# Patient Record
Sex: Male | Born: 1945 | Race: White | Hispanic: No | Marital: Married | Smoking: Current every day smoker
Health system: Southern US, Community
[De-identification: ages and names within clinical notes are randomized; demographics above are authoritative.]

## PROBLEM LIST (undated history)

## (undated) DIAGNOSIS — C3492 Malignant neoplasm of unspecified part of left bronchus or lung: Secondary | ICD-10-CM

## (undated) DIAGNOSIS — R569 Unspecified convulsions: Secondary | ICD-10-CM

## (undated) HISTORY — PX: CHOLECYSTECTOMY: SHX55

## (undated) HISTORY — DX: Unspecified convulsions: R56.9

## (undated) HISTORY — DX: Malignant neoplasm of unspecified part of left bronchus or lung: C34.92

---

## 2013-05-22 ENCOUNTER — Telehealth: Payer: Self-pay | Admitting: *Deleted

## 2013-05-22 MED ORDER — LEVETIRACETAM 500 MG PO TABS: 500.0000 mg | ORAL_TABLET | Freq: Two times a day (BID) | ORAL | Status: DC

## 2013-05-22 NOTE — Telephone Encounter (Signed)
Patient scheduled an appt, Rx has been sent.

## 2013-07-24 ENCOUNTER — Encounter: Payer: Self-pay | Admitting: Nurse Practitioner

## 2013-07-25 ENCOUNTER — Encounter: Payer: Self-pay | Admitting: Nurse Practitioner

## 2013-07-25 ENCOUNTER — Ambulatory Visit (INDEPENDENT_AMBULATORY_CARE_PROVIDER_SITE_OTHER): Payer: Medicare Other | Admitting: Nurse Practitioner

## 2013-07-25 ENCOUNTER — Encounter (INDEPENDENT_AMBULATORY_CARE_PROVIDER_SITE_OTHER): Payer: Self-pay

## 2013-07-25 VITALS — BP 133/77 | HR 76 | Ht 65.0 in | Wt 150.0 lb

## 2013-07-25 DIAGNOSIS — R569 Unspecified convulsions: Secondary | ICD-10-CM

## 2013-07-25 DIAGNOSIS — G40209 Localization-related (focal) (partial) symptomatic epilepsy and epileptic syndromes with complex partial seizures, not intractable, without status epilepticus: Secondary | ICD-10-CM

## 2013-07-25 MED ORDER — LEVETIRACETAM 500 MG PO TABS: 500.0000 mg | ORAL_TABLET | Freq: Two times a day (BID) | ORAL | Status: DC
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
## 2013-07-25 NOTE — Patient Instructions (Signed)
Continue Keppra 500 mg twice daily, will refill for one year Call for any further seizure activity Followup yearly and when necessary

## 2014-05-08 ENCOUNTER — Encounter: Payer: Self-pay | Admitting: Neurology

## 2014-05-14 ENCOUNTER — Encounter: Payer: Self-pay | Admitting: Neurology

## 2014-07-25 ENCOUNTER — Ambulatory Visit: Payer: Self-pay | Admitting: Neurology

## 2014-08-06 ENCOUNTER — Other Ambulatory Visit: Payer: Self-pay | Admitting: Nurse Practitioner

## 2014-09-03 ENCOUNTER — Ambulatory Visit: Payer: Self-pay | Admitting: Neurology

## 2014-09-06 ENCOUNTER — Encounter: Payer: Self-pay | Admitting: Neurology

## 2014-09-06 ENCOUNTER — Ambulatory Visit (INDEPENDENT_AMBULATORY_CARE_PROVIDER_SITE_OTHER): Payer: Medicare Other | Admitting: Neurology

## 2014-09-06 VITALS — BP 123/76 | HR 84 | Ht 65.0 in | Wt 142.0 lb

## 2014-09-06 DIAGNOSIS — G40209 Localization-related (focal) (partial) symptomatic epilepsy and epileptic syndromes with complex partial seizures, not intractable, without status epilepticus: Secondary | ICD-10-CM | POA: Diagnosis not present

## 2014-09-06 MED ORDER — LEVETIRACETAM 500 MG PO TABS: 500.0000 mg | ORAL_TABLET | Freq: Two times a day (BID) | ORAL | Status: AC
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
## 2014-09-10 ENCOUNTER — Other Ambulatory Visit: Payer: Self-pay | Admitting: Neurology

## 2014-09-10 NOTE — Telephone Encounter (Signed)
#  180 + 3 refills was sent on 03/18

## 2015-06-01 ENCOUNTER — Emergency Department: Payer: Medicare Other

## 2015-06-01 ENCOUNTER — Encounter: Payer: Self-pay | Admitting: Emergency Medicine

## 2015-06-01 ENCOUNTER — Emergency Department
Admission: EM | Admit: 2015-06-01 | Discharge: 2015-06-01 | Disposition: A | Discharge status: Home / Self Care | Payer: Medicare Other | Attending: Emergency Medicine | Admitting: Emergency Medicine

## 2015-06-01 DIAGNOSIS — R079 Chest pain, unspecified: Secondary | ICD-10-CM | POA: Diagnosis not present

## 2015-06-01 DIAGNOSIS — Y998 Other external cause status: Secondary | ICD-10-CM | POA: Diagnosis not present

## 2015-06-01 DIAGNOSIS — Y9389 Activity, other specified: Secondary | ICD-10-CM | POA: Insufficient documentation

## 2015-06-01 DIAGNOSIS — S39012A Strain of muscle, fascia and tendon of lower back, initial encounter: Secondary | ICD-10-CM | POA: Insufficient documentation

## 2015-06-01 DIAGNOSIS — R0602 Shortness of breath: Secondary | ICD-10-CM | POA: Diagnosis not present

## 2015-06-01 DIAGNOSIS — Y9289 Other specified places as the place of occurrence of the external cause: Secondary | ICD-10-CM | POA: Diagnosis not present

## 2015-06-01 DIAGNOSIS — X58XXXA Exposure to other specified factors, initial encounter: Secondary | ICD-10-CM | POA: Diagnosis not present

## 2015-06-01 DIAGNOSIS — F17200 Nicotine dependence, unspecified, uncomplicated: Secondary | ICD-10-CM | POA: Diagnosis not present

## 2015-06-01 DIAGNOSIS — Z79899 Other long term (current) drug therapy: Secondary | ICD-10-CM | POA: Diagnosis not present

## 2015-06-01 DIAGNOSIS — M79605 Pain in left leg: Secondary | ICD-10-CM | POA: Insufficient documentation

## 2015-06-01 DIAGNOSIS — M545 Low back pain: Secondary | ICD-10-CM | POA: Diagnosis present

## 2015-06-01 LAB — CBC WITH DIFFERENTIAL/PLATELET
Basophils Absolute: 0 10*3/uL (ref 0–0.1)
Basophils Relative: 1 %
EOS ABS: 0.1 10*3/uL (ref 0–0.7)
EOS PCT: 1 %
HCT: 42.4 % (ref 40.0–52.0)
Hemoglobin: 14.5 g/dL (ref 13.0–18.0)
LYMPHS ABS: 1.8 10*3/uL (ref 1.0–3.6)
Lymphocytes Relative: 22 %
MCH: 29.9 pg (ref 26.0–34.0)
MCHC: 34.2 g/dL (ref 32.0–36.0)
MCV: 87.5 fL (ref 80.0–100.0)
MONOS PCT: 6 %
Monocytes Absolute: 0.5 10*3/uL (ref 0.2–1.0)
Neutro Abs: 6 10*3/uL (ref 1.4–6.5)
Neutrophils Relative %: 72 %
PLATELETS: 142 10*3/uL — AB (ref 150–440)
RBC: 4.84 MIL/uL (ref 4.40–5.90)
RDW: 12.8 % (ref 11.5–14.5)
WBC: 8.4 10*3/uL (ref 3.8–10.6)

## 2015-06-01 LAB — COMPREHENSIVE METABOLIC PANEL
ALT: 21 U/L (ref 17–63)
AST: 53 U/L — AB (ref 15–41)
Albumin: 3.2 g/dL — ABNORMAL LOW (ref 3.5–5.0)
Alkaline Phosphatase: 77 U/L (ref 38–126)
Anion gap: 9 (ref 5–15)
BUN: 23 mg/dL — AB (ref 6–20)
CHLORIDE: 98 mmol/L — AB (ref 101–111)
CO2: 29 mmol/L (ref 22–32)
CREATININE: 1.18 mg/dL (ref 0.61–1.24)
Calcium: 9.6 mg/dL (ref 8.9–10.3)
GFR calc Af Amer: >60 mL/min (ref >60)
Glucose, Bld: 108 mg/dL — ABNORMAL HIGH (ref 65–99)
Potassium: 3.8 mmol/L (ref 3.5–5.1)
Sodium: 136 mmol/L (ref 135–145)
Total Bilirubin: 0.8 mg/dL (ref 0.3–1.2)
Total Protein: 7.4 g/dL (ref 6.5–8.1)

## 2015-06-01 MED ORDER — DIAZEPAM 5 MG PO TABS: 5.0000 mg | ORAL_TABLET | Freq: Four times a day (QID) | ORAL | Status: DC | PRN

## 2015-06-01 MED ORDER — GABAPENTIN 100 MG PO CAPS: 100.0000 mg | ORAL_CAPSULE | Freq: Three times a day (TID) | ORAL | Status: DC

## 2015-06-01 NOTE — ED Notes (Signed)
Pt reports low back pain for 2 weeks.  Also reports left leg pain.

## 2015-06-01 NOTE — ED Notes (Signed)
Discussed discharge instructions, prescriptions, and follow-up care with patient. No questions or concerns at this time. Pt stable at discharge.  
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
## 2015-06-01 NOTE — Discharge Instructions (Signed)

## 2015-06-09 ENCOUNTER — Emergency Department (HOSPITAL_COMMUNITY): Payer: Medicare Other

## 2015-06-09 ENCOUNTER — Inpatient Hospital Stay (HOSPITAL_COMMUNITY)
Admission: EM | Admit: 2015-06-09 | Discharge: 2015-06-12 | DRG: 987 | Disposition: A | Discharge status: Home Health | Payer: Medicare Other | Attending: Internal Medicine | Admitting: Internal Medicine

## 2015-06-09 ENCOUNTER — Encounter (HOSPITAL_COMMUNITY): Payer: Self-pay | Admitting: Cardiology

## 2015-06-09 DIAGNOSIS — C3432 Malignant neoplasm of lower lobe, left bronchus or lung: Secondary | ICD-10-CM | POA: Diagnosis present

## 2015-06-09 DIAGNOSIS — Z23 Encounter for immunization: Secondary | ICD-10-CM

## 2015-06-09 DIAGNOSIS — C7951 Secondary malignant neoplasm of bone: Secondary | ICD-10-CM | POA: Diagnosis present

## 2015-06-09 DIAGNOSIS — G40209 Localization-related (focal) (partial) symptomatic epilepsy and epileptic syndromes with complex partial seizures, not intractable, without status epilepticus: Secondary | ICD-10-CM | POA: Diagnosis not present

## 2015-06-09 DIAGNOSIS — M79606 Pain in leg, unspecified: Secondary | ICD-10-CM

## 2015-06-09 DIAGNOSIS — M549 Dorsalgia, unspecified: Secondary | ICD-10-CM

## 2015-06-09 DIAGNOSIS — I745 Embolism and thrombosis of iliac artery: Secondary | ICD-10-CM | POA: Diagnosis not present

## 2015-06-09 DIAGNOSIS — C787 Secondary malignant neoplasm of liver and intrahepatic bile duct: Secondary | ICD-10-CM | POA: Diagnosis present

## 2015-06-09 DIAGNOSIS — I998 Other disorder of circulatory system: Secondary | ICD-10-CM

## 2015-06-09 DIAGNOSIS — C3490 Malignant neoplasm of unspecified part of unspecified bronchus or lung: Secondary | ICD-10-CM | POA: Diagnosis present

## 2015-06-09 DIAGNOSIS — J188 Other pneumonia, unspecified organism: Secondary | ICD-10-CM | POA: Diagnosis present

## 2015-06-09 DIAGNOSIS — F17210 Nicotine dependence, cigarettes, uncomplicated: Secondary | ICD-10-CM | POA: Diagnosis not present

## 2015-06-09 DIAGNOSIS — I739 Peripheral vascular disease, unspecified: Secondary | ICD-10-CM | POA: Diagnosis not present

## 2015-06-09 DIAGNOSIS — M545 Low back pain: Secondary | ICD-10-CM | POA: Diagnosis present

## 2015-06-09 DIAGNOSIS — M79605 Pain in left leg: Secondary | ICD-10-CM | POA: Diagnosis present

## 2015-06-09 DIAGNOSIS — Z79899 Other long term (current) drug therapy: Secondary | ICD-10-CM | POA: Diagnosis not present

## 2015-06-09 DIAGNOSIS — C799 Secondary malignant neoplasm of unspecified site: Secondary | ICD-10-CM

## 2015-06-09 DIAGNOSIS — C3492 Malignant neoplasm of unspecified part of left bronchus or lung: Secondary | ICD-10-CM | POA: Diagnosis not present

## 2015-06-09 DIAGNOSIS — R590 Localized enlarged lymph nodes: Secondary | ICD-10-CM | POA: Diagnosis present

## 2015-06-09 LAB — COMPREHENSIVE METABOLIC PANEL
ALBUMIN: 2.5 g/dL — AB (ref 3.5–5.0)
ALK PHOS: 173 U/L — AB (ref 38–126)
ALT: 34 U/L (ref 17–63)
AST: 59 U/L — AB (ref 15–41)
Anion gap: 11 (ref 5–15)
BILIRUBIN TOTAL: 0.6 mg/dL (ref 0.3–1.2)
BUN: 23 mg/dL — AB (ref 6–20)
CHLORIDE: 95 mmol/L — AB (ref 101–111)
CO2: 30 mmol/L (ref 22–32)
CREATININE: 1.29 mg/dL — AB (ref 0.61–1.24)
Calcium: 10.5 mg/dL — ABNORMAL HIGH (ref 8.9–10.3)
GFR calc Af Amer: >60 mL/min (ref >60)
GFR, EST NON AFRICAN AMERICAN: 55 mL/min — AB (ref >60)
Glucose, Bld: 114 mg/dL — ABNORMAL HIGH (ref 65–99)
POTASSIUM: 4.2 mmol/L (ref 3.5–5.1)
Sodium: 136 mmol/L (ref 135–145)
Total Protein: 6.8 g/dL (ref 6.5–8.1)

## 2015-06-09 LAB — I-STAT CHEM 8, ED
BUN: 26 mg/dL — AB (ref 6–20)
BUN: 23 mg/dL — AB (ref 6–20)
CALCIUM ION: 1.3 mmol/L (ref 1.13–1.30)
CHLORIDE: 95 mmol/L — AB (ref 101–111)
CREATININE: 1.2 mg/dL (ref 0.61–1.24)
CREATININE: 1.2 mg/dL (ref 0.61–1.24)
Calcium, Ion: 1.35 mmol/L — ABNORMAL HIGH (ref 1.13–1.30)
Chloride: 94 mmol/L — ABNORMAL LOW (ref 101–111)
Glucose, Bld: 110 mg/dL — ABNORMAL HIGH (ref 65–99)
Glucose, Bld: 101 mg/dL — ABNORMAL HIGH (ref 65–99)
HCT: 40 % (ref 39.0–52.0)
HEMATOCRIT: 40 % (ref 39.0–52.0)
HEMOGLOBIN: 13.6 g/dL (ref 13.0–17.0)
Hemoglobin: 13.6 g/dL (ref 13.0–17.0)
POTASSIUM: 4.2 mmol/L (ref 3.5–5.1)
Potassium: 3.7 mmol/L (ref 3.5–5.1)
SODIUM: 136 mmol/L (ref 135–145)
SODIUM: 134 mmol/L — AB (ref 135–145)
TCO2: 33 mmol/L (ref 0–100)
TCO2: 30 mmol/L (ref 0–100)

## 2015-06-09 LAB — CBC WITH DIFFERENTIAL/PLATELET
BASOS ABS: 0 10*3/uL (ref 0.0–0.1)
BASOS PCT: 0 %
Eosinophils Absolute: 0 10*3/uL (ref 0.0–0.7)
Eosinophils Relative: 0 %
HEMATOCRIT: 38.1 % — AB (ref 39.0–52.0)
HEMOGLOBIN: 12.9 g/dL — AB (ref 13.0–17.0)
LYMPHS PCT: 25 %
Lymphs Abs: 1.7 10*3/uL (ref 0.7–4.0)
MCH: 29.8 pg (ref 26.0–34.0)
MCHC: 33.9 g/dL (ref 30.0–36.0)
MCV: 88 fL (ref 78.0–100.0)
MONO ABS: 0.3 10*3/uL (ref 0.1–1.0)
Monocytes Relative: 5 %
NEUTROS ABS: 4.7 10*3/uL (ref 1.7–7.7)
NEUTROS PCT: 70 %
Platelets: 132 10*3/uL — ABNORMAL LOW (ref 150–400)
RBC: 4.33 MIL/uL (ref 4.22–5.81)
RDW: 12.4 % (ref 11.5–15.5)
WBC: 6.8 10*3/uL (ref 4.0–10.5)

## 2015-06-09 MED ORDER — PIPERACILLIN-TAZOBACTAM 3.375 G IVPB 30 MIN
3.3750 g | Freq: Once | INTRAVENOUS | Status: AC
  Administered 2015-06-09: 3.375 g via INTRAVENOUS
  Filled 2015-06-09: qty 50

## 2015-06-09 MED ORDER — VANCOMYCIN HCL IN DEXTROSE 1-5 GM/200ML-% IV SOLN
1000.0000 mg | Freq: Once | INTRAVENOUS | Status: AC
  Administered 2015-06-09: 1000 mg via INTRAVENOUS
  Filled 2015-06-09: qty 200

## 2015-06-09 MED ORDER — PIPERACILLIN-TAZOBACTAM 4.5 G IVPB: 4.5000 g | Freq: Once | INTRAVENOUS | Status: DC

## 2015-06-09 MED ORDER — IOHEXOL 350 MG/ML SOLN
100.0000 mL | Freq: Once | INTRAVENOUS | Status: AC | PRN
  Administered 2015-06-09: 100 mL via INTRAVENOUS

## 2015-06-09 NOTE — ED Notes (Signed)
Pt reports left lower back pain and leg pain over the past couple of weeks. States he was seen at [facility] and told it was muscle pain. States he is still having pain. No abd pain, or urinary symptoms.

## 2015-06-09 NOTE — ED Notes (Signed)
Report attempted 

## 2015-06-09 NOTE — ED Provider Notes (Signed)
CSN: [id number]     Arrival date & time 06/09/15  1451 History   First MD Initiated Contact with Patient 06/09/15 1622     Chief Complaint  Patient presents with  . Back Pain  . Leg Pain     (Consider location/radiation/quality/duration/timing/severity/associated sxs/prior Treatment) HPI Comments:  Patient reports 1 month of diffuse low back pain denies any specific injury but does do lifting at work. He states has been getting worse and radiated down his left leg. He was seen at [facility] and told it was muscle pain. He is given Flexeril and Valium which helped somewhat.  He denies any falls eliciting the injury but has had multiple falls since due to his pain. He denies any weakness in the leg. He denies any numbness or tingling. Denies any nausea or vomiting. No fever. No bowel or bladder incontinence. No history of cancer or IV drug abuse.  Patient is a 69 y.o. male presenting with leg pain. The history is provided by the patient.  Leg Pain Associated symptoms: back pain   Associated symptoms: no fever and no neck pain     Past Medical History  Diagnosis Date  . Other convulsions    Past Surgical History  Procedure Laterality Date  . Cholecystectomy     History reviewed. No pertinent family history. Social History  Substance Use Topics  . Smoking status: Current Every Day Smoker -- 0.25 packs/day for 20 years    Types: Cigarettes  . Smokeless tobacco: Never Used  . Alcohol Use: No    Review of Systems  Constitutional: Negative for fever, activity change and appetite change.  HENT: Negative for congestion and hearing loss.   Respiratory: Negative for cough, chest tightness and shortness of breath.   Cardiovascular: Negative for chest pain.  Gastrointestinal: Negative for nausea, vomiting and abdominal pain.  Genitourinary: Negative for dysuria and hematuria.  Musculoskeletal: Positive for myalgias, back pain and arthralgias. Negative for neck pain.  Skin: Negative for  pallor and rash.  Neurological: Negative for dizziness, weakness and headaches.   A complete 10 system review of systems was obtained and all systems are negative except as noted in the HPI and PMH.     Allergies  Review of patient's allergies indicates no known allergies.  Home Medications   Prior to Admission medications   Medication Sig Start Date End Date Taking? Authorizing Provider  acetaminophen (TYLENOL) 500 MG tablet Take 1,000 mg by mouth every 6 (six) hours as needed for mild pain or headache.   Yes Historical Provider, MD  diazepam (VALIUM) 5 MG tablet Take 1 tablet (5 mg total) by mouth every 6 (six) hours as needed for muscle spasms. 06/01/15  Yes Pierce Crane Beers, PA-C  gabapentin (NEURONTIN) 100 MG capsule Take 1 capsule (100 mg total) by mouth 3 (three) times daily. For 3 days, then increase to 2 tabs 3 times daily 06/01/15 05/31/16 Yes Pierce Crane Beers, PA-C  levETIRAcetam (KEPPRA) 500 MG tablet Take 1 tablet (500 mg total) by mouth 2 (two) times daily. 09/06/14  Yes Marcial Pacas, MD   BP 150/69 mmHg  Pulse 96  Temp(Src) 98.5 F (36.9 C) (Oral)  Resp 18  Ht '5\' 4"'$  (1.626 m)  Wt 123 lb 6.4 oz (55.974 kg)  BMI 21.17 kg/m2  SpO2 93% Physical Exam  Constitutional: He is oriented to person, place, and time. He appears well-developed and well-nourished. No distress.  HENT:  Head: Normocephalic and atraumatic.  Mouth/Throat: Oropharynx is clear and moist. No  oropharyngeal exudate.  Eyes: Conjunctivae and EOM are normal. Pupils are equal, round, and reactive to light.  Neck: Normal range of motion. Neck supple.  No meningismus.  Cardiovascular: Normal rate, regular rhythm, normal heart sounds and intact distal pulses.   No murmur heard. Pulmonary/Chest: Effort normal and breath sounds normal. No respiratory distress.  Abdominal: Soft. There is no tenderness. There is no rebound and no guarding.  Musculoskeletal: Normal range of motion. He exhibits tenderness. He exhibits no  edema.   Paraspinal lumbar tenderness, no midline tenderness  5/5 strength in bilateral lower extremities. Ankle plantar and dorsiflexion intact. Great toe extension intact bilaterally. +2 patellar reflexes bilaterally. Normal gait.   Difficult to palpate PT and DP pulses bilaterally.  able to Doppler DP and PT pulses on the right.  Unable to palpate left femoral , popliteal, DP or PT pulses. Femoral and popliteal pulses are dopplerable.  Neurological: He is alert and oriented to person, place, and time. No cranial nerve deficit. He exhibits normal muscle tone. Coordination normal.  No ataxia on finger to nose bilaterally. No pronator drift. 5/5 strength throughout. CN 2-12 intact.Equal grip strength. Sensation intact.   Skin: Skin is warm.  Psychiatric: He has a normal mood and affect. His behavior is normal.  Nursing note and vitals reviewed.   ED Course  Procedures (including critical care time) Labs Review Labs Reviewed  CBC WITH DIFFERENTIAL/PLATELET - Abnormal; Notable for the following:    Hemoglobin 12.9 (*)    HCT 38.1 (*)    Platelets 132 (*)    All other components within normal limits  COMPREHENSIVE METABOLIC PANEL - Abnormal; Notable for the following:    Chloride 95 (*)    Glucose, Bld 114 (*)    BUN 23 (*)    Creatinine, Ser 1.29 (*)    Calcium 10.5 (*)    Albumin 2.5 (*)    AST 59 (*)    Alkaline Phosphatase 173 (*)    GFR calc non Af Amer 55 (*)    All other components within normal limits  I-STAT CHEM 8, ED - Abnormal; Notable for the following:    Chloride 94 (*)    BUN 26 (*)    Glucose, Bld 110 (*)    Calcium, Ion 1.35 (*)    All other components within normal limits  I-STAT CHEM 8, ED - Abnormal; Notable for the following:    Sodium 134 (*)    Chloride 95 (*)    BUN 23 (*)    Glucose, Bld 101 (*)    All other components within normal limits  CULTURE, BLOOD (ROUTINE X 2)  CULTURE, BLOOD (ROUTINE X 2)  I-STAT CG4 LACTIC ACID, ED  I-STAT CG4 LACTIC  ACID, ED    Imaging Review Dg Chest 2 View  06/09/2015  CLINICAL DATA:  Productive cough EXAM: CHEST  2 VIEW COMPARISON:  June 01, 2015 FINDINGS: There remains extensive airspace consolidation in the superior segment of the left lower lobe. This finding is stable compared to recent prior study. No new parenchymal lung opacity identified. There is extensive hilar and mediastinal adenopathy, stable from recent prior study. Heart size and pulmonary vascularity normal.  No bone lesions. IMPRESSION: Extensive airspace consolidation in the superior segment of the left lower lobe remains. Widespread adenopathy is also present. Given this adenopathy, contrast enhanced chest CT remains warranted, both to better delineate the adenopathy as well as to assess for possible endobronchial lesion in the superior segment left lower lobe region.  Electronically Signed   By: Lowella Grip III M.D.   On: 06/09/2015 18:00   Ct Chest W Contrast  06/09/2015  CLINICAL DATA:  69 year old male with shortness of breath, pulseless left leg and mass on aorta EXAM: CT ANGIOGRAPHY OF CHEST, ABDOMEN AND PELVIS WITH ILIOFEMORAL RUNOFF TECHNIQUE: Multidetector CT imaging of the abdomen, pelvis and lower extremities was performed using the standard protocol during bolus administration of intravenous contrast. Multiplanar CT image reconstructions and MIPs were obtained to evaluate the vascular anatomy. CONTRAST:  121m OMNIPAQUE IOHEXOL 350 MG/ML SOLN COMPARISON:  Prior chest x-ray 06/09/2015 FINDINGS: CTA CHEST Mediastinum: Bilateral left slightly greater than right supraclavicular adenopathy with the largest nodes measuring 15 mm in short axis on the left. There is extensive confluent adenopathy throughout the mediastinum and both hila. Measurement of individual index nodes is difficult given the confluence of the adenopathy. However, the dominant right paratracheal node measures 28 x 34 cm in greatest diameter. The dominant  subcarinal node measures approximately 34 x 44 mm. The prevascular nodal conglomerate measures approximately 27 by 48 mm at the common origin of the right brachiocephalic and left common carotid arteries. Heart/Vascular: 2 vessel aortic arch. The right brachiocephalic and left common carotid arteries share a common origin. Scattered atherosclerotic plaque throughout the aorta. No aneurysm or dissection. Heterogeneous and irregular fibro fatty plaque and possible wall adherent mural thrombus. Calcifications are present throughout the coronary arteries. The heart is normal limits in size. No pericardial effusion. Lungs/Pleura: Confluent adenopathy from the mediastinum and left hilum extends in a peribronchovascular distribution along the right upper lobe and right lower lobe bronchi and pulmonary arteries. There is a 1.7 cm round area of low attenuation within the resultant macro lobulated left lower lobe mass consistent with an area of internal necrosis. Precise measurements of the lung mass are challenging giving its extent and a amorphous shape. The lesion measures approximately 8.0 by 6.3 by 9.1 cm. There is extensive ground-glass attenuation opacity throughout the remainder of the left lower lobe as well as nodular thickening along the major fissure consistent with lipid ache growth and interstitial spread of tumor. Multiple nodular metastases are present within the left lower lobe measuring up to 12 mm. Additional pulmonary nodules are noted in the left upper lobe measuring up to 4 mm concerning for distant metastases. Nonspecific subpleural nodularity present within the right upper lobe. There is a background of centrilobular pulmonary emphysema. Bones/Soft Tissues: No acute fracture or aggressive appearing lytic or blastic osseous lesion. CTA ABD/PELVIS WITH RUNOFF VASCULAR Aorta: Irregular heterogeneous atherosclerotic plaque throughout the abdominal aorta. No aneurysm. Celiac: Mild narrowing at the origin.   No visceral artery aneurysm. SMA: Mild narrowing at the origin. Accessory right hepatic artery from the SMA. No aneurysm or significant stenosis. Renals: Multiple (4) left-sided renal arteries. Two right renal arteries. No significant stenosis identified on either side. No evidence of dissection or renal artery aneurysm. No changes of fibromuscular dysplasia. IMA: At least moderate narrowing at the origin. The vessel remains patent distally. RIGHT Lower Extremity Inflow: On the right, there is modest narrowing of the origin of the internal iliac artery. The common iliac artery is diseased but remains patent and without significant stenosis. Outflow: Fibro fatty plaque in the common femoral artery without significant stenosis. The profunda femoral artery is patent. The superficial femoral artery is diffusely diseased with multifocal areas of moderate and high-grade stenosis. There may be a focal short segment occlusion versus critical stenosis in the adductor canal. The popliteal  artery is diseased but not significantly narrowed. Runoff: High-grade stenosis of the origin of the anterior tibial artery an within the distal tibioperoneal trunk. The anterior tibial artery becomes tiny at the ankle but there does appear to be three-vessel runoff to the ankle. LEFT lower Extremity Inflow: Heterogeneous and irregular atherosclerotic plaque in the bilateral common iliac arteries with at least moderate narrowing on the left. There is flush occlusion of the left external iliac artery beginning at the origin. High-grade stenosis at the origin of the left internal iliac artery but the distal branches remain patent. The distal left external iliac artery reconstitutes via retrograde flow from the common femoral artery. Outflow: Heterogeneous fibro fatty plaque with moderate narrowing throughout the common femoral artery. There is moderate narrowing at the origin of the profunda femoral artery. The superficial femoral artery  appears chronically occluded just beyond its origin but reconstitutes in the adductor canal secondary to geniculate collaterals. The popliteal artery is diseased and mildly narrowed but patent. Runoff: Limited evaluation of the runoff arteries secondary to decreased contrast bolus and calcified plaque. There appears to be significant stenosis at the origin of the anterior tibial artery and throughout the tibioperoneal trunk. Patency of the runoff vessels below the tibioperoneal trunk is not well assessed by CTA. Veins: No focal venous abnormality. Review of the MIP images confirms the above findings. NON-VASCULAR Abdomen: Unremarkable CT appearance of the stomach, duodenum, spleen, adrenal glands and pancreas. Normal hepatic contour and morphology. There are several at least for a subtle low-attenuation lesions in the right hemi liver concerning for metastatic disease. The largest located posteriorly measures 1.8 cm but may represent a region of diaphragmatic infolding. The second largest lesion measures 17 mm in the anterior aspect of hepatic segment 5. The gallbladder is surgically absent. No intra or extrahepatic biliary ductal dilatation. Unremarkable appearance of the bilateral kidneys. No focal solid lesion, hydronephrosis or nephrolithiasis. No evidence of obstruction or focal bowel wall thickening. Normal appendix in the right lower quadrant. The terminal ileum is unremarkable. No free fluid or suspicious abdominal adenopathy. Pelvis: Mild prostatomegaly. Unremarkable appearance of the bladder and seminal vesicles. No free fluid or suspicious adenopathy. Bones/Soft Tissues: Soft tissue density within the marrow space of the proximal femurs bilaterally concerning for areas of possible osseous metastasis. No other definitive lytic or blastic osseous lesions. IMPRESSION: CTA CHEST 1. 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 Given the morphology, small cell carcinoma is  suspected. The dominant mass appears to be within the left lower lobe and there is extensive confluent bi hilar, mediastinal and bilateral supraclavicular adenopathy as well as evidence of interstitial spread of tumor throughout the left lower lobe with extension into the pleural space of the major fissure. Additionally, there are likely metastatic implants within the left upper lobe parenchyma. For tissue diagnosis, consider ultrasound-guided biopsy of the supraclavicular adenopathy. 2. Small low-attenuation lesions within liver are concerning for hepatic metastatic disease. 3. Round soft tissue density implants within the marrow space of the bilateral proximal femoral diaphyses concerning for osseous metastatic disease. 4. Heterogeneous and irregular atherosclerotic plaque throughout the thoracic aorta. No aneurysm or dissection. 5. Coronary artery calcifications. 6. Centrilobular pulmonary emphysema. CTA ABD/PELVIS WITH RUNOFF 1. 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 Depending on patient's symptomatology, this may be acute or chronic. 2. 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 Again,  this may be acute or chronic. Chronic occlusion is favored given the background of significant peripheral arterial disease. 3. Significant left sided runoff disease. Patency of the runoff vessels to the ankle cannot be assessed due to poor contrast bolus from the multifocal left lower extremity occlusive disease. 4. On the right there is significant multifocal superficial femoral artery disease with multiple moderate and at least 1 high-grade stenosis. Additionally, there is significant proximal runoff disease on the right although the runoff vessels remain patent to the ankle. 5. At least 4 low-attenuation liver lesions concerning for metastatic disease. 6. 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 Signed, Criselda Peaches, MD [facility] Electronically Signed   By: Jacqulynn Cadet M.D.   On: 06/09/2015 21:19   Ct Angio Ao+bifem W/cm &/or Wo/cm  06/09/2015  CLINICAL DATA:  69 year old male with shortness of breath, pulseless left leg and mass on aorta EXAM: CT ANGIOGRAPHY OF CHEST, ABDOMEN AND PELVIS WITH ILIOFEMORAL RUNOFF TECHNIQUE: Multidetector CT imaging of the abdomen, pelvis and lower extremities was performed using the standard protocol during bolus administration of intravenous contrast. Multiplanar CT image reconstructions and MIPs were obtained to evaluate the vascular anatomy. CONTRAST:  173m OMNIPAQUE IOHEXOL 350 MG/ML SOLN COMPARISON:  Prior chest x-ray 06/09/2015 FINDINGS: CTA CHEST Mediastinum: Bilateral left slightly greater than right supraclavicular adenopathy with the largest nodes measuring 15 mm in short axis on the left. There is extensive confluent adenopathy throughout the mediastinum and both hila. Measurement of individual index nodes is difficult given the confluence of the adenopathy. However, the dominant right paratracheal node measures 28 x 34 cm in greatest diameter. The dominant subcarinal node measures approximately 34 x 44 mm. The prevascular nodal conglomerate measures approximately 27 by 48 mm at the common origin of the right brachiocephalic and left common carotid arteries. Heart/Vascular: 2 vessel aortic arch. The right brachiocephalic and left common carotid arteries share a common origin. Scattered atherosclerotic plaque throughout the aorta. No aneurysm or dissection. Heterogeneous and irregular fibro fatty plaque and possible wall adherent mural thrombus. Calcifications are present throughout the coronary arteries. The heart is normal limits in size. No pericardial effusion. Lungs/Pleura: Confluent  adenopathy from the mediastinum and left hilum extends in a peribronchovascular distribution along the right upper lobe and right lower lobe bronchi and pulmonary arteries. There is a 1.7 cm round area of low attenuation within the resultant macro lobulated left lower lobe mass consistent with an area of internal necrosis. Precise measurements of the lung mass are challenging giving its extent and a amorphous shape. The lesion measures approximately 8.0 by 6.3 by 9.1 cm. There is extensive ground-glass attenuation opacity throughout the remainder of the left lower lobe as well as nodular thickening along the major fissure consistent with lipid ache growth and interstitial spread of tumor. Multiple nodular metastases are present within the left lower lobe measuring up to 12 mm. Additional pulmonary nodules are noted in the left upper lobe measuring up to 4 mm concerning for distant metastases. Nonspecific subpleural nodularity present within the right upper lobe. There is a background of centrilobular pulmonary emphysema. Bones/Soft Tissues: No acute fracture or aggressive appearing lytic or blastic osseous lesion. CTA ABD/PELVIS WITH RUNOFF VASCULAR Aorta: Irregular heterogeneous atherosclerotic plaque throughout the abdominal aorta. No aneurysm. Celiac: Mild narrowing at the origin.  No visceral artery aneurysm. SMA: Mild narrowing at the origin. Accessory right hepatic artery from the SMA. No aneurysm or  significant stenosis. Renals: Multiple (4) left-sided renal arteries. Two right renal arteries. No significant stenosis identified on either side. No evidence of dissection or renal artery aneurysm. No changes of fibromuscular dysplasia. IMA: At least moderate narrowing at the origin. The vessel remains patent distally. RIGHT Lower Extremity Inflow: On the right, there is modest narrowing of the origin of the internal iliac artery. The common iliac artery is diseased but remains patent and without significant  stenosis. Outflow: Fibro fatty plaque in the common femoral artery without significant stenosis. The profunda femoral artery is patent. The superficial femoral artery is diffusely diseased with multifocal areas of moderate and high-grade stenosis. There may be a focal short segment occlusion versus critical stenosis in the adductor canal. The popliteal artery is diseased but not significantly narrowed. Runoff: High-grade stenosis of the origin of the anterior tibial artery an within the distal tibioperoneal trunk. The anterior tibial artery becomes tiny at the ankle but there does appear to be three-vessel runoff to the ankle. LEFT lower Extremity Inflow: Heterogeneous and irregular atherosclerotic plaque in the bilateral common iliac arteries with at least moderate narrowing on the left. There is flush occlusion of the left external iliac artery beginning at the origin. High-grade stenosis at the origin of the left internal iliac artery but the distal branches remain patent. The distal left external iliac artery reconstitutes via retrograde flow from the common femoral artery. Outflow: Heterogeneous fibro fatty plaque with moderate narrowing throughout the common femoral artery. There is moderate narrowing at the origin of the profunda femoral artery. The superficial femoral artery appears chronically occluded just beyond its origin but reconstitutes in the adductor canal secondary to geniculate collaterals. The popliteal artery is diseased and mildly narrowed but patent. Runoff: Limited evaluation of the runoff arteries secondary to decreased contrast bolus and calcified plaque. There appears to be significant stenosis at the origin of the anterior tibial artery and throughout the tibioperoneal trunk. Patency of the runoff vessels below the tibioperoneal trunk is not well assessed by CTA. Veins: No focal venous abnormality. Review of the MIP images confirms the above findings. NON-VASCULAR Abdomen: Unremarkable CT  appearance of the stomach, duodenum, spleen, adrenal glands and pancreas. Normal hepatic contour and morphology. There are several at least for a subtle low-attenuation lesions in the right hemi liver concerning for metastatic disease. The largest located posteriorly measures 1.8 cm but may represent a region of diaphragmatic infolding. The second largest lesion measures 17 mm in the anterior aspect of hepatic segment 5. The gallbladder is surgically absent. No intra or extrahepatic biliary ductal dilatation. Unremarkable appearance of the bilateral kidneys. No focal solid lesion, hydronephrosis or nephrolithiasis. No evidence of obstruction or focal bowel wall thickening. Normal appendix in the right lower quadrant. The terminal ileum is unremarkable. No free fluid or suspicious abdominal adenopathy. Pelvis: Mild prostatomegaly. Unremarkable appearance of the bladder and seminal vesicles. No free fluid or suspicious adenopathy. Bones/Soft Tissues: Soft tissue density within the marrow space of the proximal femurs bilaterally concerning for areas of possible osseous metastasis. No other definitive lytic or blastic osseous lesions. IMPRESSION: CTA CHEST 1. 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 The dominant mass appears to be within the left lower lobe and there is extensive confluent bi hilar, mediastinal and bilateral supraclavicular adenopathy as well as evidence of interstitial spread of tumor throughout the left lower lobe with extension into the pleural space of the major fissure. Additionally, there are  likely metastatic implants within the left upper lobe parenchyma. For tissue diagnosis, consider ultrasound-guided biopsy of the supraclavicular adenopathy. 2. Small low-attenuation lesions within liver are concerning for hepatic metastatic disease. 3. Round soft tissue density implants within the marrow space of the  bilateral proximal femoral diaphyses concerning for osseous metastatic disease. 4. Heterogeneous and irregular atherosclerotic plaque throughout the thoracic aorta. No aneurysm or dissection. 5. Coronary artery calcifications. 6. Centrilobular pulmonary emphysema. CTA ABD/PELVIS WITH RUNOFF 1. 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 Depending on patient's symptomatology, this may be acute or chronic. 2. 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 Again, this may be acute or chronic. Chronic occlusion is favored given the background of significant peripheral arterial disease. 3. Significant left sided runoff disease. Patency of the runoff vessels to the ankle cannot be assessed due to poor contrast bolus from the multifocal left lower extremity occlusive disease. 4. On the right there is significant multifocal superficial femoral artery disease with multiple moderate and at least 1 high-grade stenosis. Additionally, there is significant proximal runoff disease on the right although the runoff vessels remain patent to the ankle. 5. At least 4 low-attenuation liver lesions concerning for metastatic disease. 6. Suspect osseous metastatic disease involving the medullary space of the bilateral proximal femoral diaphyses. Signed, Criselda Peaches, MD [facility] Radiology Electronically Signed   By: Jacqulynn Cadet M.D.   On: 06/09/2015 21:19   I have personally reviewed and evaluated these images and lab results as part of my medical decision-making.   EKG Interpretation None      MDM   Final diagnoses:  Leg pain  Metastatic primary lung cancer, unspecified laterality (HCC)  Limb ischemia    diffuse lumbar back pain radiating to left leg. No focal weakness on exam. Intact DTRs.   unable to  Doppler DP or PT pulse of the left foot. Dopplerable femoral and popliteal pulses but they're not palpable. Discussed with Dr. Tawni Millers of vascular surgery. We'll proceed with CT angiogram of leg with runoff. Does not recommend heparin at this time.   Patient's chest x-ray is also concerning for lung cancer with pneumonia. This is similar to his x-ray December 11 and he was not aware of the results.   CT angiogram shows extensive vascular disease including occlusion of left external iliac and left SFA. There is reconstitution distally. Discussed with Dr. Donnetta Hutching who saw patient. He feels when ischemia is chronic and patient does need to have bypass operation but this is not emergent.   The more significant history appears to be his newly diagnosed metastatic lung cancer.   Patient is not any respiratory distress denies any chest pain.   He has poor outpatient follow-up and will be admitted to the hospital for treatment of his lung cancer with possible pneumonia as well as severe peripheral vascular disease and chronic limb ischemia.  Admission d.w Dr. Eulas Post.  CRITICAL CARE Performed by: Ezequiel Essex Total critical care time: 40 minutes Critical care time was exclusive of separately billable procedures and treating other patients. Critical care was necessary to treat or prevent imminent or life-threatening deterioration. Critical care was time spent personally by me on the following activities: development of treatment plan with patient and/or surrogate as well as nursing, discussions with consultants, evaluation of patient's response to treatment, examination of patient, obtaining history from patient  or surrogate, ordering and performing treatments and interventions, ordering and review of laboratory studies, ordering and review of radiographic studies, pulse oximetry and re-evaluation of patient's condition.     Ezequiel Essex, MD 06/10/15 [phone]

## 2015-06-09 NOTE — Consult Note (Signed)
Patient name: Edwin Mckay MRN: [id number] DOB: January 11, 1946 Sex: male   Referred by: EDP  Reason for referral:  Chief Complaint  Patient presents with  . Back Pain  . Leg Pain    HISTORY OF PRESENT ILLNESS: The patient presents today to the emergency department with the back pain and leg pain. He reports that the back pain and leg pain is been present for several months. He attributes it to time when he lifted a 50 pound weight. It has been progressive. He reports that this is difficult for him to position himself and has to raise himself by way of his elbows to be a little walk. He does report using a walker to make himself more steady. He reported initially if he lay would lay flat on the floor this would improve this but this is a not helping currently. He was seen at [facility] on 06/01/2015.  The provider's notes that time report that his left leg was cool compared to the right although there was no specific evaluation of this. He did have a negative venous DVT study. He was given medication for back spasm and pain. He reports this has made it somewhat better but that he does have a continued difficulty. He does have a long history of smoking. Reports that he has been trying to quit. Does have the productive cough recently and reports this is been present for several weeks to months as well. Denies fevers. Does not have specific rest pain in his left foot.  Past Medical History  Diagnosis Date  . Other convulsions     Past Surgical History  Procedure Laterality Date  . Cholecystectomy      Social History   Social History  . Marital Status: Married    Spouse Name: Olegario Shearer  . Number of Children: 2  . Years of Education: 9th    Occupational History  . Not on file.   Social History Main Topics  . Smoking status: Current Every Day Smoker  . Smokeless tobacco: Never Used  . Alcohol Use: No  . Drug Use: No  . Sexual Activity: Not on file   Other Topics  Concern  . Not on file   Social History Narrative   Patient lives at home with wife and son.    Patient has a 9th grade education.    Patient has 2 children.           History reviewed. No pertinent family history.  Allergies as of 06/09/2015  . (No Known Allergies)    No current facility-administered medications on file prior to encounter.   Current Outpatient Prescriptions on File Prior to Encounter  Medication Sig Dispense Refill  . diazepam (VALIUM) 5 MG tablet Take 1 tablet (5 mg total) by mouth every 6 (six) hours as needed for muscle spasms. 30 tablet 0  . gabapentin (NEURONTIN) 100 MG capsule Take 1 capsule (100 mg total) by mouth 3 (three) times daily. For 3 days, then increase to 2 tabs 3 times daily 30 capsule 0  . levETIRAcetam (KEPPRA) 500 MG tablet Take 1 tablet (500 mg total) by mouth 2 (two) times daily. 180 tablet 3     REVIEW OF SYSTEMS:  Reviewed in his other notes with nothing else to add. Does have a history of seizure disorder.  PHYSICAL EXAMINATION:  General: The patient is a well-nourished male, in no acute distress. Vital signs are BP 155/77 mmHg  Pulse 92  Temp(Src) 97.6  F (36.4 C) (Oral)  Resp 16  SpO2 96% Pulmonary: There is a good air exchange  Abdomen: Soft and non-tender no masses. Musculoskeletal: There are no major deformities.  There is no significant extremity pain. Neurologic: No focal weakness or paresthesias are detected, Skin: There are no ulcer or rashes noted. Psychiatric: The patient has normal affect. Cardiovascular: 2+ radial pulses bilaterally, 2+ right femoral pulse and 1-2+ right popliteal pulse. Absent pedal pulses on the right. On the left leg he does not have femoral popliteal or pedal pulses. Neck exam reveals a large cyst above the sternal notch. This is soft and patient reports is been present for quite some time and is not changed. Do not sense any specific adenopathy in his neck.   Chest x-ray had new finding of  severe adenopathy and possible mass  CT angiogram of his abdomen pelvis and runoff revealed sclerotic changes of his aortoiliac segments. He has a complete occlusion of his left external iliac artery with reconstitution of his common femoral artery on the left. He has occlusion of the superficial femoral artery with reconstitution of diseased popliteal and good tibial runoff. On the right there is irregularity but no flow-limiting stenosis of his right iliac system. Does have irregularity and subtotal occlusion of his right superficial femoral artery with patent popliteal and three-vessel runoff  Chest CT shows your total consolidation of his left lower lobe and a area of central necrosis or a cavitary lesion.  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 No evidence of acute ischemia with normal motor and sensory function in his left foot. Discussed this at length with the patient and his wife and son present. Explained the treatment of this would be a right to left femorofemoral bypass. Since he has no evidence of acute ischemia would defer this until he has workup of his new diagnosis of a worrisome the left lower lobe lesion and mediastinal adenopathy. Discussed with Dr.Rancour. He will consult medicine for admission. We will follow along and plan surgery when he is cleared from a medical standpoint    Joven Mom [facility] [place] Office: [phone]

## 2015-06-09 NOTE — ED Notes (Signed)
Pt transported to CT ?

## 2015-06-09 NOTE — Progress Notes (Signed)
Received report from [place], RN in ED for transfer of pt to [phone].

## 2015-06-09 NOTE — ED Notes (Signed)
Pt updated that we are awaiting CT results.

## 2015-06-09 NOTE — Progress Notes (Signed)
NURSING PROGRESS NOTE  Miranda Garber [id number] Admission Data: 06/09/2015 11:44 PM Attending Provider: Lily Kocher, MD MWN:[id number] Lysbeth Galas, MD Code Status: full Allergies:  Review of patient's allergies indicates no known allergies. Past Medical History:   has a past medical history of Other convulsions. Past Surgical History:   has past surgical history that includes Cholecystectomy. Social History:   reports that he has been smoking Cigarettes.  He has a 5 pack-year smoking history. He has never used smokeless tobacco. He reports that he does not drink alcohol or use illicit drugs.  Jackston Oaxaca is a 69 y.o. male patient admitted from ED:   Last Documented Vital Signs: Blood pressure 150/69, pulse 96, temperature 98.5 F (36.9 C), temperature source Oral, resp. rate 18, SpO2 93 %.  Cardiac Monitoring: N/A  IV Fluids:  IV in place, occlusive dsg intact without redness, IV cath antecubital right, condition patent and no redness none.   Skin: WDL  Patient/Family orientated to room. Information packet given to patient/family. Admission inpatient armband information verified with patient/family to include name and date of birth and placed on patient arm. Side rails up x 2, fall assessment and education completed with patient/family. Patient/family able to verbalize understanding of risk associated with falls and verbalized understanding to call for assistance before getting out of bed. Call light within reach. Patient/family able to voice and demonstrate understanding of unit orientation instructions.    Will continue to evaluate and treat per MD orders.   Amaryllis Dyke, RN

## 2015-06-10 ENCOUNTER — Inpatient Hospital Stay (HOSPITAL_COMMUNITY): Payer: Medicare Other

## 2015-06-10 DIAGNOSIS — M79606 Pain in leg, unspecified: Secondary | ICD-10-CM

## 2015-06-10 DIAGNOSIS — C3490 Malignant neoplasm of unspecified part of unspecified bronchus or lung: Secondary | ICD-10-CM | POA: Diagnosis present

## 2015-06-10 DIAGNOSIS — I998 Other disorder of circulatory system: Secondary | ICD-10-CM

## 2015-06-10 LAB — CBC
HCT: 35.6 % — ABNORMAL LOW (ref 39.0–52.0)
Hemoglobin: 12 g/dL — ABNORMAL LOW (ref 13.0–17.0)
MCH: 29.7 pg (ref 26.0–34.0)
MCHC: 33.7 g/dL (ref 30.0–36.0)
MCV: 88.1 fL (ref 78.0–100.0)
PLATELETS: 133 10*3/uL — AB (ref 150–400)
RBC: 4.04 MIL/uL — AB (ref 4.22–5.81)
RDW: 12.6 % (ref 11.5–15.5)
WBC: 6.5 10*3/uL (ref 4.0–10.5)

## 2015-06-10 LAB — COMPREHENSIVE METABOLIC PANEL
ALBUMIN: 2.2 g/dL — AB (ref 3.5–5.0)
ALT: 29 U/L (ref 17–63)
AST: 56 U/L — AB (ref 15–41)
Alkaline Phosphatase: 147 U/L — ABNORMAL HIGH (ref 38–126)
Anion gap: 12 (ref 5–15)
BUN: 19 mg/dL (ref 6–20)
CHLORIDE: 98 mmol/L — AB (ref 101–111)
CO2: 26 mmol/L (ref 22–32)
CREATININE: 1.3 mg/dL — AB (ref 0.61–1.24)
Calcium: 10.4 mg/dL — ABNORMAL HIGH (ref 8.9–10.3)
GFR calc Af Amer: >60 mL/min (ref >60)
GFR calc non Af Amer: 54 mL/min — ABNORMAL LOW (ref >60)
GLUCOSE: 88 mg/dL (ref 65–99)
Potassium: 3.9 mmol/L (ref 3.5–5.1)
SODIUM: 136 mmol/L (ref 135–145)
Total Bilirubin: 0.9 mg/dL (ref 0.3–1.2)
Total Protein: 6 g/dL — ABNORMAL LOW (ref 6.5–8.1)

## 2015-06-10 LAB — MRSA PCR SCREENING: MRSA by PCR: NEGATIVE

## 2015-06-10 LAB — HIV ANTIBODY (ROUTINE TESTING W REFLEX): HIV Screen 4th Generation wRfx: NONREACTIVE

## 2015-06-10 LAB — STREP PNEUMONIAE URINARY ANTIGEN: STREP PNEUMO URINARY ANTIGEN: NEGATIVE

## 2015-06-10 LAB — APTT: aPTT: 32 seconds (ref 24–37)

## 2015-06-10 LAB — PROTIME-INR
INR: 1.21 (ref 0.00–1.49)
Prothrombin Time: 15.5 seconds — ABNORMAL HIGH (ref 11.6–15.2)

## 2015-06-10 MED ORDER — PANTOPRAZOLE SODIUM 40 MG PO TBEC
40.0000 mg | DELAYED_RELEASE_TABLET | Freq: Every day | ORAL | Status: DC
  Administered 2015-06-10 – 2015-06-12 (×3): 40 mg via ORAL
  Filled 2015-06-10 (×3): qty 1

## 2015-06-10 MED ORDER — ACETAMINOPHEN 325 MG PO TABS: 650.0000 mg | ORAL_TABLET | ORAL | Status: DC | PRN

## 2015-06-10 MED ORDER — LIDOCAINE HCL (PF) 1 % IJ SOLN
INTRAMUSCULAR | Status: AC
  Administered 2015-06-10: 17:00:00
  Filled 2015-06-10: qty 10

## 2015-06-10 MED ORDER — VANCOMYCIN HCL 500 MG IV SOLR
500.0000 mg | Freq: Once | INTRAVENOUS | Status: AC
  Administered 2015-06-10: 500 mg via INTRAVENOUS
  Filled 2015-06-10: qty 500

## 2015-06-10 MED ORDER — ENOXAPARIN SODIUM 40 MG/0.4ML ~~LOC~~ SOLN
40.0000 mg | SUBCUTANEOUS | Status: DC
  Administered 2015-06-10 – 2015-06-12 (×3): 40 mg via SUBCUTANEOUS
  Filled 2015-06-10 (×3): qty 0.4

## 2015-06-10 MED ORDER — VANCOMYCIN HCL 500 MG IV SOLR
500.0000 mg | Freq: Two times a day (BID) | INTRAVENOUS | Status: DC
  Administered 2015-06-11 – 2015-06-12 (×3): 500 mg via INTRAVENOUS
  Filled 2015-06-10 (×4): qty 500

## 2015-06-10 MED ORDER — MORPHINE SULFATE (PF) 2 MG/ML IV SOLN
2.0000 mg | INTRAVENOUS | Status: DC | PRN
  Administered 2015-06-10 – 2015-06-11 (×2): 2 mg via INTRAVENOUS
  Filled 2015-06-10 (×2): qty 1

## 2015-06-10 MED ORDER — VANCOMYCIN HCL IN DEXTROSE 1-5 GM/200ML-% IV SOLN
1000.0000 mg | Freq: Once | INTRAVENOUS | Status: DC
  Filled 2015-06-10: qty 200

## 2015-06-10 MED ORDER — LEVOFLOXACIN IN D5W 750 MG/150ML IV SOLN
750.0000 mg | INTRAVENOUS | Status: DC
  Administered 2015-06-10: 750 mg via INTRAVENOUS
  Filled 2015-06-10 (×2): qty 150

## 2015-06-10 MED ORDER — GUAIFENESIN 100 MG/5ML PO SOLN: 5.0000 mL | ORAL | Status: DC | PRN

## 2015-06-10 MED ORDER — SODIUM CHLORIDE 0.9 % IV SOLN
INTRAVENOUS | Status: DC
  Administered 2015-06-10 – 2015-06-11 (×3): via INTRAVENOUS

## 2015-06-10 MED ORDER — VANCOMYCIN HCL 500 MG IV SOLR
500.0000 mg | Freq: Two times a day (BID) | INTRAVENOUS | Status: DC
  Filled 2015-06-10: qty 500

## 2015-06-10 MED ORDER — LEVETIRACETAM 500 MG PO TABS
500.0000 mg | ORAL_TABLET | Freq: Two times a day (BID) | ORAL | Status: DC
  Administered 2015-06-10 – 2015-06-12 (×6): 500 mg via ORAL
  Filled 2015-06-10 (×6): qty 1

## 2015-06-10 NOTE — Progress Notes (Signed)
ANTIBIOTIC CONSULT NOTE - INITIAL  Pharmacy Consult for Vancomcyin Indication: 1/2 BCx growing GPC in clusters, r/o bacteremia  No Known Allergies  Patient Measurements: Height: '5\' 4"'$  (162.6 cm) Weight: 123 lb 6.4 oz (55.974 kg) IBW/kg (Calculated) : 59.2  Vital Signs: Temp: 98.1 F (36.7 C) (12/20 1451) Temp Source: Oral (12/20 1451) BP: 136/77 mmHg (12/20 1652) Pulse Rate: 92 (12/20 1451) Intake/Output from previous day: 12/19 0701 - 12/20 0700 In: 828.3 [P.O.:240; I.V.:588.3] Out: 400 [Urine:400] Intake/Output from this shift: Total I/O In: 0  Out: 200 [Urine:200]  Labs:  Recent Labs  06/09/15 1808 06/09/15 2144 06/10/15 0700  WBC 6.8  --  6.5  HGB 12.9* 13.6 12.0*  PLT 132*  --  133*  CREATININE 1.29* 1.20 1.30*   Estimated Creatinine Clearance: 41.9 mL/min (by C-G formula based on Cr of 1.3). No results for input(s): VANCOTROUGH, VANCOPEAK, VANCORANDOM, GENTTROUGH, GENTPEAK, GENTRANDOM, TOBRATROUGH, TOBRAPEAK, TOBRARND, AMIKACINPEAK, AMIKACINTROU, AMIKACIN in the last 72 hours.   Microbiology: Recent Results (from the past 720 hour(s))  Blood culture (routine x 2)     Status: None (Preliminary result)   Collection Time: 06/09/15  9:33 PM  Result Value Ref Range Status   Specimen Description BLOOD RIGHT ANTECUBITAL  Final   Special Requests BOTTLES DRAWN AEROBIC AND ANAEROBIC 5CC  Final   Culture  Setup Time   Final    GRAM POSITIVE COCCI IN CLUSTERS AEROBIC BOTTLE ONLY CRITICAL RESULT CALLED TO, READ BACK BY AND VERIFIED WITH: A JOHNSON RN 2800 06/10/15 A BROWNING    Culture NO GROWTH < 24 HOURS  Final   Report Status PENDING  Incomplete  Blood culture (routine x 2)     Status: None (Preliminary result)   Collection Time: 06/09/15  9:35 PM  Result Value Ref Range Status   Specimen Description BLOOD LEFT ANTECUBITAL  Final   Special Requests BOTTLES DRAWN AEROBIC AND ANAEROBIC 4CC  Final   Culture NO GROWTH < 24 HOURS  Final   Report Status PENDING   Incomplete    Medical History: Past Medical History  Diagnosis Date  . Other convulsions     Assessment: 27 YOM who presented on 12/20 with persistent leg pain and weakness. Work-up in the ED revealed a LLL lung mass (8x6x9cm) with associated lymphadenopathy concerning for lung cancer so the patient was admitted for further evaluation. The patient was started on Levaquin per MD for empiric post-obstructive PNA coverage. Blood cultures this evening are updated to show 1/2 GPC in clusters - and pharmacy has been consulted to add Vancomycin until this can be ruled out as a contaminant. Wt: 56 kg, SCr 1.3, Crcl~40-45 ml/min.   The patient received a dose of Vancomycin 1g on 12/19 @ 2212.   Goal of Therapy:  Vancomycin trough level 15-20 mcg/ml  Plan:  1. Start Vancomycin 500 mg IV every 12 hours 2. Will continue to follow renal function, culture results, LOT, and antibiotic de-escalation plans   Alycia Rossetti, PharmD, BCPS Clinical Pharmacist Pager: [phone] 06/10/2015 6:24 PM

## 2015-06-10 NOTE — Procedures (Signed)
Interventional Radiology Procedure Note  Procedure:  US guided core biopsy RIGHT supraclavicular LN  Complications: None  Estimated Blood Loss: 0 mL  Recommendations: - Path pending  Signed,  Criselda Peaches, MD

## 2015-06-10 NOTE — Progress Notes (Signed)
Patient ID: Edwin Mckay, male   DOB: Jun 30, 1945, 69 y.o.   MRN: [id number] Comfortable this morning. Denied rest pain in his feet last night. No acute distress  No change in physical exam. Calves and feet are non-tender.  Impression and plan chronic lower extremity arterial insufficiency. No need for urgent intervention. Workup of probable stage IV lung cancer ongoing. Following with you.

## 2015-06-10 NOTE — H&P (Signed)
[facility] History and Physical  Ender Rorke QQI:[id number] DOB: 11-30-1945 DOA: 06/09/2015  PCP: Morton Peters, MD  Specialists: Dr. Curt Jews (Vascular Surgery)  Chief Complaint: leg pain  HPI: Edwin Mckay is a 69 y.o. gentleman with a history of seizure who actually presented to the ED at [facility] almost one week ago for evaluation of left leg pain and weakness that has been present for several months, but worse over the past three weeks.  DVT was ruled out and the patient was diagnosed with muscle spasm.  He was given prescriptions for neurontin and valium and referred for outpatient follow-up.  He comes to the the ED tonight complaining of continued leg pain and weakness.  He has also developed a cough productive of clear sputum.  No fevers, chills, or sweats.  No chest pain or shortness of breath.  No syncope or LOC.  No dysuria.  He admits that his appetite has been poor but he is not sure that he has lost weight.  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  He has been seen by vascular surgery, who says surgery is needed but no emergently because findings are not acute.  Hospitalist asked to admit for further evaluation of lung mass.  Review of Systems: 12 systems reviewed and negative except as stated in HPI.  Past Medical History  Diagnosis Date  . Other convulsions    Past Surgical History  Procedure Laterality Date  . Cholecystectomy     Social History:  Social History   Social History Narrative   Patient lives at home with wife and son.    Patient has a 9th grade education.    Patient has 2 children.         Active tobacco use daily for  the past 30 years.  No EtOH or illicit drug use.  No Known Allergies  History reviewed. No pertinent family history.  Prior to Admission medications   Medication Sig Start Date End Date Taking? Authorizing Provider  acetaminophen (TYLENOL) 500 MG tablet Take 1,000 mg by mouth every 6 (six) hours as needed for mild pain or headache.   Yes Historical Provider, MD  diazepam (VALIUM) 5 MG tablet Take 1 tablet (5 mg total) by mouth every 6 (six) hours as needed for muscle spasms. 06/01/15  Yes Pierce Crane Beers, PA-C  gabapentin (NEURONTIN) 100 MG capsule Take 1 capsule (100 mg total) by mouth 3 (three) times daily. For 3 days, then increase to 2 tabs 3 times daily 06/01/15 05/31/16 Yes Pierce Crane Beers, PA-C  levETIRAcetam (KEPPRA) 500 MG tablet Take 1 tablet (500 mg total) by mouth 2 (two) times daily. 09/06/14  Yes Marcial Pacas, MD   Physical Exam: Filed Vitals:   06/09/15 2100 06/09/15 2145 06/09/15 2200 06/09/15 2315  BP: 116/73 122/70 110/64 150/69  Pulse: 91 93 91 96  Temp:    98.5 F (36.9 C)  TempSrc:    Oral  Resp: '15 16 15 18  '$ Height:    '5\' 4"'$  (1.626 m)  Weight:    55.974 kg (123 lb 6.4 oz)  SpO2: 93% 95% 93% 93%     General:  Awake and alert, NAD, No O2 requirement  Eyes: PERRL bilaterally, EOMI  ENT: No nasal drainage, mucous membranes are dry  Neck: Thin, large cyst present at base of neck  Cardiovascular: NR/RR  Respiratory: Coarse bilaterally but no wheeze  Abdomen: Soft nontender nondistended  Skin: Warm and dry, no rash  Musculoskeletal: Moves all four extremities spontaneously  Psychiatric: Normal mood and affect  Neurologic: No focal deficits   Labs on Admission:  Basic Metabolic Panel:  Recent Labs Lab 06/09/15 1805 06/09/15 1808 06/09/15 2144  NA 136 136 134*  K 4.2 4.2 3.7  CL 94* 95* 95*  CO2  --  30  --   GLUCOSE 110* 114* 101*  BUN 26* 23* 23*  CREATININE 1.20 1.29* 1.20  CALCIUM  --  10.5*  --    Liver Function Tests:  Recent  Labs Lab 06/09/15 1808  AST 59*  ALT 34  ALKPHOS 173*  BILITOT 0.6  PROT 6.8  ALBUMIN 2.5*   CBC:  Recent Labs Lab 06/09/15 1805 06/09/15 1808 06/09/15 2144  WBC  --  6.8  --   NEUTROABS  --  4.7  --   HGB 13.6 12.9* 13.6  HCT 40.0 38.1* 40.0  MCV  --  88.0  --   PLT  --  132*  --     Radiological Exams on Admission: Dg Chest 2 View  06/09/2015  CLINICAL DATA:  Productive cough EXAM: CHEST  2 VIEW COMPARISON:  June 01, 2015 FINDINGS: There remains extensive airspace consolidation in the superior segment of the left lower lobe. This finding is stable compared to recent prior study. No new parenchymal lung opacity identified. There is extensive hilar and mediastinal adenopathy, stable from recent prior study. Heart size and pulmonary vascularity normal.  No bone lesions. IMPRESSION: Extensive airspace consolidation in the superior segment of the left lower lobe remains. Widespread adenopathy is also present. Given this adenopathy, contrast enhanced chest CT remains warranted, both to better delineate the adenopathy as well as to assess for possible endobronchial lesion in the superior segment left lower lobe region. Electronically Signed   By: Lowella Grip III M.D.   On: 06/09/2015 18:00   Ct Chest W Contrast  06/09/2015  CLINICAL DATA:  69 year old male with shortness of breath, pulseless left leg and mass on aorta EXAM: CT ANGIOGRAPHY OF CHEST, ABDOMEN AND PELVIS WITH ILIOFEMORAL RUNOFF TECHNIQUE: Multidetector CT imaging of the abdomen, pelvis and lower extremities was performed using the standard protocol during bolus administration of intravenous contrast. Multiplanar CT image reconstructions and MIPs were obtained to evaluate the vascular anatomy. CONTRAST:  166m OMNIPAQUE IOHEXOL 350 MG/ML SOLN COMPARISON:  Prior chest x-ray 06/09/2015 FINDINGS: CTA CHEST Mediastinum: Bilateral left slightly greater than right supraclavicular adenopathy with the largest nodes measuring  15 mm in short axis on the left. There is extensive confluent adenopathy throughout the mediastinum and both hila. Measurement of individual index nodes is difficult given the confluence of the adenopathy. However, the dominant right paratracheal node measures 28 x 34 cm in greatest diameter. The dominant subcarinal node measures approximately 34 x 44 mm. The prevascular nodal conglomerate measures approximately 27 by 48 mm at the common origin of the right brachiocephalic and left common carotid arteries. Heart/Vascular: 2 vessel aortic arch. The right brachiocephalic and left common carotid arteries share a common origin. Scattered atherosclerotic plaque throughout the aorta. No aneurysm or dissection. Heterogeneous and irregular fibro fatty plaque and possible wall adherent mural thrombus. Calcifications are present throughout the coronary arteries. The heart is normal limits in size. No pericardial effusion.  Lungs/Pleura: Confluent adenopathy from the mediastinum and left hilum extends in a peribronchovascular distribution along the right upper lobe and right lower lobe bronchi and pulmonary arteries. There is a 1.7 cm round area of low attenuation within the resultant macro lobulated left lower lobe mass consistent with an area of internal necrosis. Precise measurements of the lung mass are challenging giving its extent and a amorphous shape. The lesion measures approximately 8.0 by 6.3 by 9.1 cm. There is extensive ground-glass attenuation opacity throughout the remainder of the left lower lobe as well as nodular thickening along the major fissure consistent with lipid ache growth and interstitial spread of tumor. Multiple nodular metastases are present within the left lower lobe measuring up to 12 mm. Additional pulmonary nodules are noted in the left upper lobe measuring up to 4 mm concerning for distant metastases. Nonspecific subpleural nodularity present within the right upper lobe. There is a background  of centrilobular pulmonary emphysema. Bones/Soft Tissues: No acute fracture or aggressive appearing lytic or blastic osseous lesion. CTA ABD/PELVIS WITH RUNOFF VASCULAR Aorta: Irregular heterogeneous atherosclerotic plaque throughout the abdominal aorta. No aneurysm. Celiac: Mild narrowing at the origin.  No visceral artery aneurysm. SMA: Mild narrowing at the origin. Accessory right hepatic artery from the SMA. No aneurysm or significant stenosis. Renals: Multiple (4) left-sided renal arteries. Two right renal arteries. No significant stenosis identified on either side. No evidence of dissection or renal artery aneurysm. No changes of fibromuscular dysplasia. IMA: At least moderate narrowing at the origin. The vessel remains patent distally. RIGHT Lower Extremity Inflow: On the right, there is modest narrowing of the origin of the internal iliac artery. The common iliac artery is diseased but remains patent and without significant stenosis. Outflow: Fibro fatty plaque in the common femoral artery without significant stenosis. The profunda femoral artery is patent. The superficial femoral artery is diffusely diseased with multifocal areas of moderate and high-grade stenosis. There may be a focal short segment occlusion versus critical stenosis in the adductor canal. The popliteal artery is diseased but not significantly narrowed. Runoff: High-grade stenosis of the origin of the anterior tibial artery an within the distal tibioperoneal trunk. The anterior tibial artery becomes tiny at the ankle but there does appear to be three-vessel runoff to the ankle. LEFT lower Extremity Inflow: Heterogeneous and irregular atherosclerotic plaque in the bilateral common iliac arteries with at least moderate narrowing on the left. There is flush occlusion of the left external iliac artery beginning at the origin. High-grade stenosis at the origin of the left internal iliac artery but the distal branches remain patent. The distal  left external iliac artery reconstitutes via retrograde flow from the common femoral artery. Outflow: Heterogeneous fibro fatty plaque with moderate narrowing throughout the common femoral artery. There is moderate narrowing at the origin of the profunda femoral artery. The superficial femoral artery appears chronically occluded just beyond its origin but reconstitutes in the adductor canal secondary to geniculate collaterals. The popliteal artery is diseased and mildly narrowed but patent. Runoff: Limited evaluation of the runoff arteries secondary to decreased contrast bolus and calcified plaque. There appears to be significant stenosis at the origin of the anterior tibial artery and throughout the tibioperoneal trunk. Patency of the runoff vessels below the tibioperoneal trunk is not well assessed by CTA. Veins: No focal venous abnormality. Review of the MIP images confirms the above findings. NON-VASCULAR Abdomen: Unremarkable CT appearance of the stomach, duodenum, spleen, adrenal glands and pancreas. Normal hepatic contour and morphology. There are  several at least for a subtle low-attenuation lesions in the right hemi liver concerning for metastatic disease. The largest located posteriorly measures 1.8 cm but may represent a region of diaphragmatic infolding. The second largest lesion measures 17 mm in the anterior aspect of hepatic segment 5. The gallbladder is surgically absent. No intra or extrahepatic biliary ductal dilatation. Unremarkable appearance of the bilateral kidneys. No focal solid lesion, hydronephrosis or nephrolithiasis. No evidence of obstruction or focal bowel wall thickening. Normal appendix in the right lower quadrant. The terminal ileum is unremarkable. No free fluid or suspicious abdominal adenopathy. Pelvis: Mild prostatomegaly. Unremarkable appearance of the bladder and seminal vesicles. No free fluid or suspicious adenopathy. Bones/Soft Tissues: Soft tissue density within the marrow  space of the proximal femurs bilaterally concerning for areas of possible osseous metastasis. No other definitive lytic or blastic osseous lesions. IMPRESSION: CTA CHEST 1. 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 The dominant mass appears to be within the left lower lobe and there is extensive confluent bi hilar, mediastinal and bilateral supraclavicular adenopathy as well as evidence of interstitial spread of tumor throughout the left lower lobe with extension into the pleural space of the major fissure. Additionally, there are likely metastatic implants within the left upper lobe parenchyma. For tissue diagnosis, consider ultrasound-guided biopsy of the supraclavicular adenopathy. 2. Small low-attenuation lesions within liver are concerning for hepatic metastatic disease. 3. Round soft tissue density implants within the marrow space of the bilateral proximal femoral diaphyses concerning for osseous metastatic disease. 4. Heterogeneous and irregular atherosclerotic plaque throughout the thoracic aorta. No aneurysm or dissection. 5. Coronary artery calcifications. 6. Centrilobular pulmonary emphysema. CTA ABD/PELVIS WITH RUNOFF 1. 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 Depending on patient's symptomatology, this may be acute or chronic. 2. 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 Again, this may be acute or chronic. Chronic occlusion is favored given the background of significant peripheral arterial disease. 3. Significant left sided runoff disease. Patency of the runoff vessels to the ankle cannot be assessed due to poor contrast bolus from the multifocal left lower extremity occlusive disease. 4. On the right there is significant multifocal superficial  femoral artery disease with multiple moderate and at least 1 high-grade stenosis. Additionally, there is significant proximal runoff disease on the right although the runoff vessels remain patent to the ankle. 5. At least 4 low-attenuation liver lesions concerning for metastatic disease. 6. Suspect osseous metastatic disease involving the medullary space of the bilateral proximal femoral diaphyses. Signed, Criselda Peaches, MD [facility] Radiology Electronically Signed   By: Jacqulynn Cadet M.D.   On: 06/09/2015 21:19   Ct Angio Ao+bifem W/cm &/or Wo/cm  06/09/2015  CLINICAL DATA:  69 year old male with shortness of breath, pulseless left leg and mass on aorta EXAM: CT ANGIOGRAPHY OF CHEST, ABDOMEN AND PELVIS WITH ILIOFEMORAL RUNOFF TECHNIQUE: Multidetector CT imaging of the abdomen, pelvis and lower extremities was performed using the standard protocol during bolus administration of intravenous contrast. Multiplanar CT image reconstructions and MIPs were obtained to evaluate the vascular anatomy. CONTRAST:  165m OMNIPAQUE IOHEXOL 350 MG/ML SOLN COMPARISON:  Prior chest x-ray 06/09/2015 FINDINGS: CTA CHEST Mediastinum: Bilateral left slightly greater than right supraclavicular adenopathy with the largest nodes measuring 15 mm in short axis on the left.  There is extensive confluent adenopathy throughout the mediastinum and both hila. Measurement of individual index nodes is difficult given the confluence of the adenopathy. However, the dominant right paratracheal node measures 28 x 34 cm in greatest diameter. The dominant subcarinal node measures approximately 34 x 44 mm. The prevascular nodal conglomerate measures approximately 27 by 48 mm at the common origin of the right brachiocephalic and left common carotid arteries. Heart/Vascular: 2 vessel aortic arch. The right brachiocephalic and left common carotid arteries share a common origin. Scattered  atherosclerotic plaque throughout the aorta. No aneurysm or dissection. Heterogeneous and irregular fibro fatty plaque and possible wall adherent mural thrombus. Calcifications are present throughout the coronary arteries. The heart is normal limits in size. No pericardial effusion. Lungs/Pleura: Confluent adenopathy from the mediastinum and left hilum extends in a peribronchovascular distribution along the right upper lobe and right lower lobe bronchi and pulmonary arteries. There is a 1.7 cm round area of low attenuation within the resultant macro lobulated left lower lobe mass consistent with an area of internal necrosis. Precise measurements of the lung mass are challenging giving its extent and a amorphous shape. The lesion measures approximately 8.0 by 6.3 by 9.1 cm. There is extensive ground-glass attenuation opacity throughout the remainder of the left lower lobe as well as nodular thickening along the major fissure consistent with lipid ache growth and interstitial spread of tumor. Multiple nodular metastases are present within the left lower lobe measuring up to 12 mm. Additional pulmonary nodules are noted in the left upper lobe measuring up to 4 mm concerning for distant metastases. Nonspecific subpleural nodularity present within the right upper lobe. There is a background of centrilobular pulmonary emphysema. Bones/Soft Tissues: No acute fracture or aggressive appearing lytic or blastic osseous lesion. CTA ABD/PELVIS WITH RUNOFF VASCULAR Aorta: Irregular heterogeneous atherosclerotic plaque throughout the abdominal aorta. No aneurysm. Celiac: Mild narrowing at the origin.  No visceral artery aneurysm. SMA: Mild narrowing at the origin. Accessory right hepatic artery from the SMA. No aneurysm or significant stenosis. Renals: Multiple (4) left-sided renal arteries. Two right renal arteries. No significant stenosis identified on either side. No evidence of dissection or renal artery aneurysm. No changes  of fibromuscular dysplasia. IMA: At least moderate narrowing at the origin. The vessel remains patent distally. RIGHT Lower Extremity Inflow: On the right, there is modest narrowing of the origin of the internal iliac artery. The common iliac artery is diseased but remains patent and without significant stenosis. Outflow: Fibro fatty plaque in the common femoral artery without significant stenosis. The profunda femoral artery is patent. The superficial femoral artery is diffusely diseased with multifocal areas of moderate and high-grade stenosis. There may be a focal short segment occlusion versus critical stenosis in the adductor canal. The popliteal artery is diseased but not significantly narrowed. Runoff: High-grade stenosis of the origin of the anterior tibial artery an within the distal tibioperoneal trunk. The anterior tibial artery becomes tiny at the ankle but there does appear to be three-vessel runoff to the ankle. LEFT lower Extremity Inflow: Heterogeneous and irregular atherosclerotic plaque in the bilateral common iliac arteries with at least moderate narrowing on the left. There is flush occlusion of the left external iliac artery beginning at the origin. High-grade stenosis at the origin of the left internal iliac artery but the distal branches remain patent. The distal left external iliac artery reconstitutes via retrograde flow from the common femoral artery. Outflow: Heterogeneous fibro fatty plaque with moderate narrowing throughout the common femoral artery.  There is moderate narrowing at the origin of the profunda femoral artery. The superficial femoral artery appears chronically occluded just beyond its origin but reconstitutes in the adductor canal secondary to geniculate collaterals. The popliteal artery is diseased and mildly narrowed but patent. Runoff: Limited evaluation of the runoff arteries secondary to decreased contrast bolus and calcified plaque. There appears to be significant  stenosis at the origin of the anterior tibial artery and throughout the tibioperoneal trunk. Patency of the runoff vessels below the tibioperoneal trunk is not well assessed by CTA. Veins: No focal venous abnormality. Review of the MIP images confirms the above findings. NON-VASCULAR Abdomen: Unremarkable CT appearance of the stomach, duodenum, spleen, adrenal glands and pancreas. Normal hepatic contour and morphology. There are several at least for a subtle low-attenuation lesions in the right hemi liver concerning for metastatic disease. The largest located posteriorly measures 1.8 cm but may represent a region of diaphragmatic infolding. The second largest lesion measures 17 mm in the anterior aspect of hepatic segment 5. The gallbladder is surgically absent. No intra or extrahepatic biliary ductal dilatation. Unremarkable appearance of the bilateral kidneys. No focal solid lesion, hydronephrosis or nephrolithiasis. No evidence of obstruction or focal bowel wall thickening. Normal appendix in the right lower quadrant. The terminal ileum is unremarkable. No free fluid or suspicious abdominal adenopathy. Pelvis: Mild prostatomegaly. Unremarkable appearance of the bladder and seminal vesicles. No free fluid or suspicious adenopathy. Bones/Soft Tissues: Soft tissue density within the marrow space of the proximal femurs bilaterally concerning for areas of possible osseous metastasis. No other definitive lytic or blastic osseous lesions. IMPRESSION: CTA CHEST 1. 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 The dominant mass appears to be within the left lower lobe and there is extensive confluent bi hilar, mediastinal and bilateral supraclavicular adenopathy as well as evidence of interstitial spread of tumor throughout the left lower lobe with extension into the pleural space of the major fissure. Additionally, there are likely  metastatic implants within the left upper lobe parenchyma. For tissue diagnosis, consider ultrasound-guided biopsy of the supraclavicular adenopathy. 2. Small low-attenuation lesions within liver are concerning for hepatic metastatic disease. 3. Round soft tissue density implants within the marrow space of the bilateral proximal femoral diaphyses concerning for osseous metastatic disease. 4. Heterogeneous and irregular atherosclerotic plaque throughout the thoracic aorta. No aneurysm or dissection. 5. Coronary artery calcifications. 6. Centrilobular pulmonary emphysema. CTA ABD/PELVIS WITH RUNOFF 1. 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 Depending on patient's symptomatology, this may be acute or chronic. 2. 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 Again, this may be acute or chronic. Chronic occlusion is favored given the background of significant peripheral arterial disease. 3. Significant left sided runoff disease. Patency of the runoff vessels to the ankle cannot be assessed due to poor contrast bolus from the multifocal left lower extremity occlusive disease. 4. On the right there is significant multifocal superficial femoral artery disease with multiple moderate and at least 1 high-grade stenosis. Additionally, there is significant proximal runoff disease on the right although the runoff vessels remain patent to the ankle. 5. At least 4 low-attenuation liver lesions concerning for metastatic disease. 6. Suspect osseous metastatic disease involving the medullary space of the bilateral proximal femoral diaphyses. Signed, Criselda Peaches, MD [facility] Radiology  Electronically Signed   By: Jacqulynn Cadet M.D.   On: 06/09/2015 21:19    Assessment/Plan Active Problems:   Seizure disorder,  complex partial (HCC)   Metastatic primary lung cancer ([place])   Limb ischemia   Leg pain   Lung cancer ([place])   1. Admit to med surg  2.   LLL lung mass with adenopathy concerning for lung cancer; post-obstructive pneumonia could not be ruled out --Patient received IV vanc and zosyn in the ED; will continue with once daily levaquin for now  --Sputum culture if patient can produce sample --Blood cultures ordered in the ED --Urine legionella and streptococcal antigens --NPO after midnight; he will likely need IR referral in AM for ultrasound guided biopsy of supraclavicular lymph node.  If this is not feasible, may need pulmonary for bronch.  3.  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  Patient will eventually need consideration for revascularization.  4.  History of seizure --Continue home dose of Keppra  Code Status: FULL Disposition Plan: Expect patient to be here at least two midnights  Time spent: 60 minutes  [employer] [facility]  06/10/2015, 12:12 AM

## 2015-06-10 NOTE — Progress Notes (Signed)
Notified Dr Annamaria Boots of [facility] + called from [place]

## 2015-06-10 NOTE — Progress Notes (Addendum)
Patient seen and examined, reported has been feeling weak, difficult to ambulate for the last two months, denies  Chest pain, no sob, reported back pain, radiating down to left leg,  S/p US guided biospy, f/u on pathology report. 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 Awaiting biopsy result, patient and family updated in room.

## 2015-06-10 NOTE — Care Management Note (Addendum)
Case Management Note  Patient Details  Name: Edwin Mckay MRN: [id number] Date of Birth: [birth year]  Subjective/Objective:       Date: 06/10/15 Spoke with patient at the bedside along with wife.  Introduced self as Tourist information centre manager and explained role in discharge planning and how to be reached.  Verified patient lives in town, alone with spouse. Has rolling walker .  Expressed potential need for no other DME.  Verified patient anticipates to go home with family, at time of discharge and will have full-time supervision by family at this time to best of their knowledge.  Patient denied needing help with their medication.  Patient is driven by wife or family member to MD appointments.  Verified patient has PCP Katrina Stack. Await pt eval.  Plan: CM will continue to follow for discharge planning and [facility] resources.              Action/Plan:   Expected Discharge Date:                  Expected Discharge Plan:  [place]  In-House Referral:     Discharge planning Services  CM Consult  Post Acute Care Choice:    Choice offered to:     DME Arranged:    DME Agency:     HH Arranged:    [place] Agency:     Status of Service:  In process, will continue to follow  Medicare Important Message Given:    Date Medicare IM Given:    Medicare IM give by:    Date Additional Medicare IM Given:    Additional Medicare Important Message give by:     If discussed at [place] of Stay Meetings, dates discussed:    Additional Comments:  Zenon Mayo, RN 06/10/2015, 3:40 PM

## 2015-06-11 DIAGNOSIS — I998 Other disorder of circulatory system: Secondary | ICD-10-CM

## 2015-06-11 DIAGNOSIS — M79605 Pain in left leg: Secondary | ICD-10-CM

## 2015-06-11 DIAGNOSIS — C3490 Malignant neoplasm of unspecified part of unspecified bronchus or lung: Secondary | ICD-10-CM

## 2015-06-11 LAB — COMPREHENSIVE METABOLIC PANEL
ALBUMIN: 2.1 g/dL — AB (ref 3.5–5.0)
ALK PHOS: 128 U/L — AB (ref 38–126)
ALT: 25 U/L (ref 17–63)
ANION GAP: 11 (ref 5–15)
AST: 55 U/L — ABNORMAL HIGH (ref 15–41)
BILIRUBIN TOTAL: 1 mg/dL (ref 0.3–1.2)
BUN: 20 mg/dL (ref 6–20)
CALCIUM: 10.1 mg/dL (ref 8.9–10.3)
CO2: 25 mmol/L (ref 22–32)
Chloride: 99 mmol/L — ABNORMAL LOW (ref 101–111)
Creatinine, Ser: 1.27 mg/dL — ABNORMAL HIGH (ref 0.61–1.24)
GFR calc Af Amer: >60 mL/min (ref >60)
GFR, EST NON AFRICAN AMERICAN: 56 mL/min — AB (ref >60)
GLUCOSE: 71 mg/dL (ref 65–99)
Potassium: 4.2 mmol/L (ref 3.5–5.1)
Sodium: 135 mmol/L (ref 135–145)
TOTAL PROTEIN: 5.9 g/dL — AB (ref 6.5–8.1)

## 2015-06-11 LAB — CBC
HEMATOCRIT: 36.6 % — AB (ref 39.0–52.0)
HEMOGLOBIN: 12.5 g/dL — AB (ref 13.0–17.0)
MCH: 29.8 pg (ref 26.0–34.0)
MCHC: 34.2 g/dL (ref 30.0–36.0)
MCV: 87.4 fL (ref 78.0–100.0)
Platelets: 133 10*3/uL — ABNORMAL LOW (ref 150–400)
RBC: 4.19 MIL/uL — ABNORMAL LOW (ref 4.22–5.81)
RDW: 12.4 % (ref 11.5–15.5)
WBC: 6.7 10*3/uL (ref 4.0–10.5)

## 2015-06-11 LAB — LEGIONELLA ANTIGEN, URINE

## 2015-06-11 MED ORDER — INFLUENZA VAC SPLIT QUAD 0.5 ML IM SUSY
0.5000 mL | PREFILLED_SYRINGE | INTRAMUSCULAR | Status: AC
  Administered 2015-06-12: 0.5 mL via INTRAMUSCULAR
  Filled 2015-06-11: qty 0.5

## 2015-06-11 MED ORDER — BARIUM SULFATE 2.1 % PO SUSP
ORAL | Status: AC
  Filled 2015-06-11: qty 2

## 2015-06-11 NOTE — Evaluation (Signed)
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  Has PAD as well, not a surgical intervention per vascular surgeon.    Clinical Impression  Pt was assessed and noted that he had improved his control of gait with practice in his room with PT.  He is expecting to go directly home but he is having LLE weakness and symptoms are creating a fall risk.  Will see for PT as long as he is in hospital and will otherwise refer to SNF.    Follow Up Recommendations SNF    Equipment Recommendations  Rolling walker with 5" wheels    Recommendations for Other Services Rehab consult     Precautions / Restrictions Precautions Precautions: Fall (telemetry) Restrictions Weight Bearing Restrictions: No      Mobility  Bed Mobility Overal bed mobility: Needs Assistance Bed Mobility: Supine to Sit;Sit to Supine     Supine to sit: Min assist Sit to supine: Min assist   General bed mobility comments: cued his technque and reminded pt about body mechanics for spinal injury with bony mets  Transfers Overall transfer level: Modified independent Equipment used: Rolling walker (2 wheeled);1 person hand held assist             General transfer comment: reminded pt about hand placement which he did not follow  Ambulation/Gait Ambulation/Gait assistance: Min guard;Min assist Ambulation Distance (Feet): 60 Feet Assistive device: Rolling walker (2 wheeled);1 person hand held assist Gait Pattern/deviations: Step-through pattern;Step-to pattern;Wide base of support (awkward turns and needed reminders for lines and direction) Gait velocity: slowed Gait velocity interpretation: Below normal speed for age/gender    Stairs            Wheelchair Mobility    Modified Rankin (Stroke  Patients Only)       Balance Overall balance assessment: Needs assistance Sitting-balance support: Feet supported Sitting balance-Leahy Scale: Fair   Postural control: Posterior lean Standing balance support: Bilateral upper extremity supported Standing balance-Leahy Scale: Poor                               Pertinent Vitals/Pain Pain Assessment: Faces Pain Score: 2  Faces Pain Scale: Hurts a little bit Pain Location: L leg Pain Descriptors / Indicators: Heaviness Pain Intervention(s): Limited activity within patient's tolerance;Monitored during session;Premedicated before session;Repositioned    Home Living Family/patient expects to be discharged to:: Private residence Living Arrangements: Spouse/significant other Available Help at Discharge: Family;Available 24 hours/day Type of Home: House Home Access: Ramped entrance     Home Layout: One level Home Equipment: Walker - 2 wheels      Prior Function Level of Independence: Independent               Hand Dominance        Extremity/Trunk Assessment   Upper Extremity Assessment: Overall WFL for tasks assessed           Lower Extremity Assessment: LLE deficits/detail   LLE Deficits / Details: 4- LLE Strength and RLE WFL  Cervical / Trunk Assessment: Kyphotic  Communication   Communication: No difficulties  Cognition Arousal/Alertness: Awake/alert Behavior During Therapy: WFL for tasks assessed/performed Overall Cognitive Status: Within Functional Limits for tasks assessed  General Comments General comments (skin integrity, edema, etc.): Pt needed reminders for safe turning and path planning with RW, as he is limited in his abiltiy to focus with PT on safety.      Exercises        Assessment/Plan    PT Assessment Patient needs continued PT services  PT Diagnosis Generalized weakness;Difficulty walking   PT Problem List Decreased strength;Decreased  range of motion;Decreased activity tolerance;Decreased balance;Decreased mobility;Decreased coordination;Decreased knowledge of use of DME;Decreased safety awareness;Decreased knowledge of precautions;Cardiopulmonary status limiting activity;Decreased skin integrity;Pain  PT Treatment Interventions DME instruction;Gait training;Functional mobility training;Therapeutic activities;Therapeutic exercise;Balance training;Neuromuscular re-education;Patient/family education   PT Goals (Current goals can be found in the Care Plan section) Acute Rehab PT Goals Patient Stated Goal: To go directly home PT Goal Formulation: With patient/family Time For Goal Achievement: 06/25/15 Potential to Achieve Goals: Good    Frequency Min 2X/week   Barriers to discharge Other (comment) (will need 24/7 assistance for gati and transfers with SNF) dense assistance must be availabel    Co-evaluation               End of Session Equipment Utilized During Treatment: Gait belt Activity Tolerance: Patient tolerated treatment well;Patient limited by fatigue;Patient limited by lethargy Patient left: in chair;with call bell/phone within reach;with family/visitor present Nurse Communication: Mobility status         Time: 3112-1624 PT Time Calculation (min) (ACUTE ONLY): 27 min   Charges:   PT Evaluation $Initial PT Evaluation Tier I: 1 Procedure PT Treatments $Gait Training: 8-22 mins   PT G CodesRamond Dial 2015/06/24, 6:34 PM   Mee Hives, PT MS Acute Rehab Dept. Number: [facility] [id number] and [place] [phone]

## 2015-06-11 NOTE — Progress Notes (Signed)
[facility] PROGRESS NOTE  Milam Allbaugh NFA:[id number] DOB: 01-29-1946 DOA: 06/09/2015 PCP: Morton Peters, MD  Assessment/Plan: 1. Suspected metastatic lung cancer. -Mr. Edwin Mckay initially presented to the emergency department with complaints of left lower extremity pain. Imaging studies showing findings concerning for stage IV library bronchogenic carcinoma. -He has a history of tobacco abuse. -CT scan of abdomen and pelvis showing findings suggestive of metastatic disease involving liver. -MRI of lumbar spine showed widespread osseous metastatic disease. -On 06/10/2015 he underwent ultrasound-guided biopsy of bilateral supraclavicular adenopathy -Pathology is pending at the time of this dictation.  2. Peripheral arterial disease. -He was seen and evaluated by Dr. Donnetta Hutching of vascular surgery for chronic lower extremity arterial insufficiency. Vascular surgery did not recommend urgent intervention at this time.  3. Back pain. -He was further assessed with MRI lumbar spine that revealed widespread osseous metastatic disease. Radiology did not report evidence of pathologic fracture. -There was disc and facet degeneration with high-grade L4/5 canal stenosis.  4.  History of seizure disorder. -Continue Keppra 500 mg by mouth twice a day  Code Status: Full code Family Communication: I spoke to his wife and son were present at bedside Disposition Plan: Pending pathology   Consultants:  Vascular surgery  Procedures:  Ultrasound-guided biopsy performed on 06/10/2015  Antibiotics:  Levaquin  HPI/Subjective: Patient complains of left lower extremity pain however able to ambulate. Tolerating by mouth intake.  Objective: Filed Vitals:   06/10/15 2205 06/11/15 0636  BP: 145/68 146/83  Pulse: 101 89  Temp: 98 F (36.7 C) 98.3 F (36.8 C)  Resp: 20 20    Intake/Output Summary (Last 24 hours) at 06/11/15 1747 Last data filed at 06/11/15 4696  Gross per 24 hour   Intake   2465 ml  Output    400 ml  Net   2065 ml   Filed Weights   06/09/15 2315  Weight: 55.974 kg (123 lb 6.4 oz)    Exam:   General:  Thin, cachectic, no acute distress  Cardiovascular: Regular rate rhythm normal S1-S2 no murmurs or gallops  Respiratory: Normal respiratory effort, diminished breath tones bilaterally  Abdomen: Soft nontender nondistended  Musculoskeletal: There is pain to palpation over lower back  Data Reviewed: Basic Metabolic Panel:  Recent Labs Lab 06/09/15 1805 06/09/15 1808 06/09/15 2144 06/10/15 0700 06/11/15 0600  NA 136 136 134* 136 135  K 4.2 4.2 3.7 3.9 4.2  CL 94* 95* 95* 98* 99*  CO2  --  30  --  26 25  GLUCOSE 110* 114* 101* 88 71  BUN 26* 23* 23* 19 20  CREATININE 1.20 1.29* 1.20 1.30* 1.27*  CALCIUM  --  10.5*  --  10.4* 10.1   Liver Function Tests:  Recent Labs Lab 06/09/15 1808 06/10/15 0700 06/11/15 0600  AST 59* 56* 55*  ALT 34 29 25  ALKPHOS 173* 147* 128*  BILITOT 0.6 0.9 1.0  PROT 6.8 6.0* 5.9*  ALBUMIN 2.5* 2.2* 2.1*   No results for input(s): LIPASE, AMYLASE in the last 168 hours. No results for input(s): AMMONIA in the last 168 hours. CBC:  Recent Labs Lab 06/09/15 1805 06/09/15 1808 06/09/15 2144 06/10/15 0700 06/11/15 0600  WBC  --  6.8  --  6.5 6.7  NEUTROABS  --  4.7  --   --   --   HGB 13.6 12.9* 13.6 12.0* 12.5*  HCT 40.0 38.1* 40.0 35.6* 36.6*  MCV  --  88.0  --  88.1 87.4  PLT  --  132*  --  133* 133*   Cardiac Enzymes: No results for input(s): CKTOTAL, CKMB, CKMBINDEX, TROPONINI in the last 168 hours. BNP (last 3 results) No results for input(s): BNP in the last 8760 hours.  ProBNP (last 3 results) No results for input(s): PROBNP in the last 8760 hours.  CBG: No results for input(s): GLUCAP in the last 168 hours.  Recent Results (from the past 240 hour(s))  Blood culture (routine x 2)     Status: None (Preliminary result)   Collection Time: 06/09/15  9:33 PM  Result Value  Ref Range Status   Specimen Description BLOOD RIGHT ANTECUBITAL  Final   Special Requests BOTTLES DRAWN AEROBIC AND ANAEROBIC 5CC  Final   Culture  Setup Time   Final    GRAM POSITIVE COCCI IN CLUSTERS AEROBIC BOTTLE ONLY CRITICAL RESULT CALLED TO, READ BACK BY AND VERIFIED WITH: A JOHNSON RN [id number] 06/10/15 A BROWNING    Culture CULTURE REINCUBATED FOR BETTER GROWTH  Final   Report Status PENDING  Incomplete  Blood culture (routine x 2)     Status: None (Preliminary result)   Collection Time: 06/09/15  9:35 PM  Result Value Ref Range Status   Specimen Description BLOOD LEFT ANTECUBITAL  Final   Special Requests BOTTLES DRAWN AEROBIC AND ANAEROBIC 4CC  Final   Culture NO GROWTH 2 DAYS  Final   Report Status PENDING  Incomplete  MRSA PCR Screening     Status: None   Collection Time: 06/10/15  6:21 PM  Result Value Ref Range Status   MRSA by PCR NEGATIVE NEGATIVE Final    Comment:        The GeneXpert MRSA Assay (FDA approved for NASAL specimens only), is one component of a comprehensive MRSA colonization surveillance program. It is not intended to diagnose MRSA infection nor to guide or monitor treatment for MRSA infections.      Studies: Ct Chest W Contrast  06/09/2015  CLINICAL DATA:  69 year old male with shortness of breath, pulseless left leg and mass on aorta EXAM: CT ANGIOGRAPHY OF CHEST, ABDOMEN AND PELVIS WITH ILIOFEMORAL RUNOFF TECHNIQUE: Multidetector CT imaging of the abdomen, pelvis and lower extremities was performed using the standard protocol during bolus administration of intravenous contrast. Multiplanar CT image reconstructions and MIPs were obtained to evaluate the vascular anatomy. CONTRAST:  168m OMNIPAQUE IOHEXOL 350 MG/ML SOLN COMPARISON:  Prior chest x-ray 06/09/2015 FINDINGS: CTA CHEST Mediastinum: Bilateral left slightly greater than right supraclavicular adenopathy with the largest nodes measuring 15 mm in short axis on the left. There is extensive  confluent adenopathy throughout the mediastinum and both hila. Measurement of individual index nodes is difficult given the confluence of the adenopathy. However, the dominant right paratracheal node measures 28 x 34 cm in greatest diameter. The dominant subcarinal node measures approximately 34 x 44 mm. The prevascular nodal conglomerate measures approximately 27 by 48 mm at the common origin of the right brachiocephalic and left common carotid arteries. Heart/Vascular: 2 vessel aortic arch. The right brachiocephalic and left common carotid arteries share a common origin. Scattered atherosclerotic plaque throughout the aorta. No aneurysm or dissection. Heterogeneous and irregular fibro fatty plaque and possible wall adherent mural thrombus. Calcifications are present throughout the coronary arteries. The heart is normal limits in size. No pericardial effusion. Lungs/Pleura: Confluent adenopathy from the mediastinum and left hilum extends in a peribronchovascular distribution along the right upper lobe and right lower lobe bronchi and pulmonary arteries. There is a 1.7 cm round area of  low attenuation within the resultant macro lobulated left lower lobe mass consistent with an area of internal necrosis. Precise measurements of the lung mass are challenging giving its extent and a amorphous shape. The lesion measures approximately 8.0 by 6.3 by 9.1 cm. There is extensive ground-glass attenuation opacity throughout the remainder of the left lower lobe as well as nodular thickening along the major fissure consistent with lipid ache growth and interstitial spread of tumor. Multiple nodular metastases are present within the left lower lobe measuring up to 12 mm. Additional pulmonary nodules are noted in the left upper lobe measuring up to 4 mm concerning for distant metastases. Nonspecific subpleural nodularity present within the right upper lobe. There is a background of centrilobular pulmonary emphysema. Bones/Soft  Tissues: No acute fracture or aggressive appearing lytic or blastic osseous lesion. CTA ABD/PELVIS WITH RUNOFF VASCULAR Aorta: Irregular heterogeneous atherosclerotic plaque throughout the abdominal aorta. No aneurysm. Celiac: Mild narrowing at the origin.  No visceral artery aneurysm. SMA: Mild narrowing at the origin. Accessory right hepatic artery from the SMA. No aneurysm or significant stenosis. Renals: Multiple (4) left-sided renal arteries. Two right renal arteries. No significant stenosis identified on either side. No evidence of dissection or renal artery aneurysm. No changes of fibromuscular dysplasia. IMA: At least moderate narrowing at the origin. The vessel remains patent distally. RIGHT Lower Extremity Inflow: On the right, there is modest narrowing of the origin of the internal iliac artery. The common iliac artery is diseased but remains patent and without significant stenosis. Outflow: Fibro fatty plaque in the common femoral artery without significant stenosis. The profunda femoral artery is patent. The superficial femoral artery is diffusely diseased with multifocal areas of moderate and high-grade stenosis. There may be a focal short segment occlusion versus critical stenosis in the adductor canal. The popliteal artery is diseased but not significantly narrowed. Runoff: High-grade stenosis of the origin of the anterior tibial artery an within the distal tibioperoneal trunk. The anterior tibial artery becomes tiny at the ankle but there does appear to be three-vessel runoff to the ankle. LEFT lower Extremity Inflow: Heterogeneous and irregular atherosclerotic plaque in the bilateral common iliac arteries with at least moderate narrowing on the left. There is flush occlusion of the left external iliac artery beginning at the origin. High-grade stenosis at the origin of the left internal iliac artery but the distal branches remain patent. The distal left external iliac artery reconstitutes via  retrograde flow from the common femoral artery. Outflow: Heterogeneous fibro fatty plaque with moderate narrowing throughout the common femoral artery. There is moderate narrowing at the origin of the profunda femoral artery. The superficial femoral artery appears chronically occluded just beyond its origin but reconstitutes in the adductor canal secondary to geniculate collaterals. The popliteal artery is diseased and mildly narrowed but patent. Runoff: Limited evaluation of the runoff arteries secondary to decreased contrast bolus and calcified plaque. There appears to be significant stenosis at the origin of the anterior tibial artery and throughout the tibioperoneal trunk. Patency of the runoff vessels below the tibioperoneal trunk is not well assessed by CTA. Veins: No focal venous abnormality. Review of the MIP images confirms the above findings. NON-VASCULAR Abdomen: Unremarkable CT appearance of the stomach, duodenum, spleen, adrenal glands and pancreas. Normal hepatic contour and morphology. There are several at least for a subtle low-attenuation lesions in the right hemi liver concerning for metastatic disease. The largest located posteriorly measures 1.8 cm but may represent a region of diaphragmatic infolding. The second largest  lesion measures 17 mm in the anterior aspect of hepatic segment 5. The gallbladder is surgically absent. No intra or extrahepatic biliary ductal dilatation. Unremarkable appearance of the bilateral kidneys. No focal solid lesion, hydronephrosis or nephrolithiasis. No evidence of obstruction or focal bowel wall thickening. Normal appendix in the right lower quadrant. The terminal ileum is unremarkable. No free fluid or suspicious abdominal adenopathy. Pelvis: Mild prostatomegaly. Unremarkable appearance of the bladder and seminal vesicles. No free fluid or suspicious adenopathy. Bones/Soft Tissues: Soft tissue density within the marrow space of the proximal femurs bilaterally  concerning for areas of possible osseous metastasis. No other definitive lytic or blastic osseous lesions. IMPRESSION: CTA CHEST 1. 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 The dominant mass appears to be within the left lower lobe and there is extensive confluent bi hilar, mediastinal and bilateral supraclavicular adenopathy as well as evidence of interstitial spread of tumor throughout the left lower lobe with extension into the pleural space of the major fissure. Additionally, there are likely metastatic implants within the left upper lobe parenchyma. For tissue diagnosis, consider ultrasound-guided biopsy of the supraclavicular adenopathy. 2. Small low-attenuation lesions within liver are concerning for hepatic metastatic disease. 3. Round soft tissue density implants within the marrow space of the bilateral proximal femoral diaphyses concerning for osseous metastatic disease. 4. Heterogeneous and irregular atherosclerotic plaque throughout the thoracic aorta. No aneurysm or dissection. 5. Coronary artery calcifications. 6. Centrilobular pulmonary emphysema. CTA ABD/PELVIS WITH RUNOFF 1. 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 Depending on patient's symptomatology, this may be acute or chronic. 2. 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 Again, this may be acute or chronic. Chronic occlusion is favored given the background of significant peripheral arterial disease. 3. Significant left sided runoff disease. Patency of the runoff vessels to the ankle cannot be assessed due to poor contrast bolus from the multifocal left lower extremity occlusive disease. 4. On the right there is significant multifocal superficial femoral artery disease with multiple  moderate and at least 1 high-grade stenosis. Additionally, there is significant proximal runoff disease on the right although the runoff vessels remain patent to the ankle. 5. At least 4 low-attenuation liver lesions concerning for metastatic disease. 6. Suspect osseous metastatic disease involving the medullary space of the bilateral proximal femoral diaphyses. Signed, Criselda Peaches, MD [facility] Electronically Signed   By: Jacqulynn Cadet M.D.   On: 06/09/2015 21:19   Mr Lumbar Spine Wo Contrast  06/10/2015  CLINICAL DATA:  Back pain. EXAM: MRI LUMBAR SPINE WITHOUT CONTRAST TECHNIQUE: Multiplanar, multisequence MR imaging of the lumbar spine was performed. No intravenous contrast was administered. COMPARISON:  None. FINDINGS: Each vertebra and visualized pelvis has infiltrating marrow signal abnormality consistent with metastatic disease. No superimposed spinal pathologic fracture. There is unusual edematous signal symmetrically in the upper gluteal musculature and right more than left iliacus without visualized pathologic fracture plane either by MR or preceding CT. No visualized canal or foraminal infiltration of tumor. Normal conus signal and morphology. Degenerative changes: T12- L1: Disc: No herniation. Facets: Negative. Canal: Patent. Foramina: No impingement. L1-L2: Disc: Disc bulging and spondylotic spurring. Facets: Negative. Canal: Ventral sac flattening without impingement Foramina: No impingement. L2-L3: Disc: Bulging with superimposed right paracentral to foraminal disc protrusion. Facets: Arthropathy with mild  overgrowth. Canal: Ventral thecal sac flattening and partial right subarticular recess effacement. Foramina: No impingement. L3-L4: Disc: Bulging with probable bilateral paracentral protrusion based on sagittal sequences. Facets: Arthropathy with overgrowth Canal: Moderate triangular deformation without definite  impingement. Foramina: No impingement. L4-L5: Disc: Circumferential disc bulging Facets: Arthropathy with bony overgrowth and ligamentum flavum thickening. Canal: High-grade stenosis with complete CSF effacement Foramina: Moderate left stenosis with near complete perineural fat effacement. Right stenosis is mild. L5-S1: Disc:  No significant finding. Facets: Negative. Canal: Patent. Foramina: No impingement. IMPRESSION: 1. Widespread osseous metastatic disease. 2. Myopathic changes around the posterior pelvis tha tis likely reactive to #1. No discrete pathologic fracture identified. 3. Disc and facet degeneration with high-grade L4-5 canal stenosis. Electronically Signed   By: Monte Fantasia M.D.   On: 06/10/2015 21:25   Ct Angio Ao+bifem W/cm &/or Wo/cm  06/09/2015  CLINICAL DATA:  69 year old male with shortness of breath, pulseless left leg and mass on aorta EXAM: CT ANGIOGRAPHY OF CHEST, ABDOMEN AND PELVIS WITH ILIOFEMORAL RUNOFF TECHNIQUE: Multidetector CT imaging of the abdomen, pelvis and lower extremities was performed using the standard protocol during bolus administration of intravenous contrast. Multiplanar CT image reconstructions and MIPs were obtained to evaluate the vascular anatomy. CONTRAST:  163m OMNIPAQUE IOHEXOL 350 MG/ML SOLN COMPARISON:  Prior chest x-ray 06/09/2015 FINDINGS: CTA CHEST Mediastinum: Bilateral left slightly greater than right supraclavicular adenopathy with the largest nodes measuring 15 mm in short axis on the left. There is extensive confluent adenopathy throughout the mediastinum and both hila. Measurement of individual index nodes is difficult given the confluence of the adenopathy. However, the dominant right paratracheal node measures 28 x 34 cm in greatest diameter. The dominant subcarinal node measures approximately 34 x 44 mm. The prevascular nodal conglomerate measures approximately 27 by 48 mm at the common origin of the right brachiocephalic and left common  carotid arteries. Heart/Vascular: 2 vessel aortic arch. The right brachiocephalic and left common carotid arteries share a common origin. Scattered atherosclerotic plaque throughout the aorta. No aneurysm or dissection. Heterogeneous and irregular fibro fatty plaque and possible wall adherent mural thrombus. Calcifications are present throughout the coronary arteries. The heart is normal limits in size. No pericardial effusion. Lungs/Pleura: Confluent adenopathy from the mediastinum and left hilum extends in a peribronchovascular distribution along the right upper lobe and right lower lobe bronchi and pulmonary arteries. There is a 1.7 cm round area of low attenuation within the resultant macro lobulated left lower lobe mass consistent with an area of internal necrosis. Precise measurements of the lung mass are challenging giving its extent and a amorphous shape. The lesion measures approximately 8.0 by 6.3 by 9.1 cm. There is extensive ground-glass attenuation opacity throughout the remainder of the left lower lobe as well as nodular thickening along the major fissure consistent with lipid ache growth and interstitial spread of tumor. Multiple nodular metastases are present within the left lower lobe measuring up to 12 mm. Additional pulmonary nodules are noted in the left upper lobe measuring up to 4 mm concerning for distant metastases. Nonspecific subpleural nodularity present within the right upper lobe. There is a background of centrilobular pulmonary emphysema. Bones/Soft Tissues: No acute fracture or aggressive appearing lytic or blastic osseous lesion. CTA ABD/PELVIS WITH RUNOFF VASCULAR Aorta: Irregular heterogeneous atherosclerotic plaque throughout the abdominal aorta. No aneurysm. Celiac: Mild narrowing at the origin.  No visceral artery aneurysm. SMA: Mild narrowing at the origin. Accessory right hepatic artery from the SMA. No aneurysm or significant stenosis. Renals:  Multiple (4) left-sided renal  arteries. Two right renal arteries. No significant stenosis identified on either side. No evidence of dissection or renal artery aneurysm. No changes of fibromuscular dysplasia. IMA: At least moderate narrowing at the origin. The vessel remains patent distally. RIGHT Lower Extremity Inflow: On the right, there is modest narrowing of the origin of the internal iliac artery. The common iliac artery is diseased but remains patent and without significant stenosis. Outflow: Fibro fatty plaque in the common femoral artery without significant stenosis. The profunda femoral artery is patent. The superficial femoral artery is diffusely diseased with multifocal areas of moderate and high-grade stenosis. There may be a focal short segment occlusion versus critical stenosis in the adductor canal. The popliteal artery is diseased but not significantly narrowed. Runoff: High-grade stenosis of the origin of the anterior tibial artery an within the distal tibioperoneal trunk. The anterior tibial artery becomes tiny at the ankle but there does appear to be three-vessel runoff to the ankle. LEFT lower Extremity Inflow: Heterogeneous and irregular atherosclerotic plaque in the bilateral common iliac arteries with at least moderate narrowing on the left. There is flush occlusion of the left external iliac artery beginning at the origin. High-grade stenosis at the origin of the left internal iliac artery but the distal branches remain patent. The distal left external iliac artery reconstitutes via retrograde flow from the common femoral artery. Outflow: Heterogeneous fibro fatty plaque with moderate narrowing throughout the common femoral artery. There is moderate narrowing at the origin of the profunda femoral artery. The superficial femoral artery appears chronically occluded just beyond its origin but reconstitutes in the adductor canal secondary to geniculate collaterals. The popliteal artery is diseased and mildly narrowed but  patent. Runoff: Limited evaluation of the runoff arteries secondary to decreased contrast bolus and calcified plaque. There appears to be significant stenosis at the origin of the anterior tibial artery and throughout the tibioperoneal trunk. Patency of the runoff vessels below the tibioperoneal trunk is not well assessed by CTA. Veins: No focal venous abnormality. Review of the MIP images confirms the above findings. NON-VASCULAR Abdomen: Unremarkable CT appearance of the stomach, duodenum, spleen, adrenal glands and pancreas. Normal hepatic contour and morphology. There are several at least for a subtle low-attenuation lesions in the right hemi liver concerning for metastatic disease. The largest located posteriorly measures 1.8 cm but may represent a region of diaphragmatic infolding. The second largest lesion measures 17 mm in the anterior aspect of hepatic segment 5. The gallbladder is surgically absent. No intra or extrahepatic biliary ductal dilatation. Unremarkable appearance of the bilateral kidneys. No focal solid lesion, hydronephrosis or nephrolithiasis. No evidence of obstruction or focal bowel wall thickening. Normal appendix in the right lower quadrant. The terminal ileum is unremarkable. No free fluid or suspicious abdominal adenopathy. Pelvis: Mild prostatomegaly. Unremarkable appearance of the bladder and seminal vesicles. No free fluid or suspicious adenopathy. Bones/Soft Tissues: Soft tissue density within the marrow space of the proximal femurs bilaterally concerning for areas of possible osseous metastasis. No other definitive lytic or blastic osseous lesions. IMPRESSION: CTA CHEST 1. 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 The dominant mass appears to be within the left lower lobe and there is extensive confluent bi hilar, mediastinal and bilateral supraclavicular adenopathy as well as evidence of  interstitial spread of tumor throughout the left lower lobe with extension into the pleural space of the major fissure. Additionally, there are likely metastatic  implants within the left upper lobe parenchyma. For tissue diagnosis, consider ultrasound-guided biopsy of the supraclavicular adenopathy. 2. Small low-attenuation lesions within liver are concerning for hepatic metastatic disease. 3. Round soft tissue density implants within the marrow space of the bilateral proximal femoral diaphyses concerning for osseous metastatic disease. 4. Heterogeneous and irregular atherosclerotic plaque throughout the thoracic aorta. No aneurysm or dissection. 5. Coronary artery calcifications. 6. Centrilobular pulmonary emphysema. CTA ABD/PELVIS WITH RUNOFF 1. 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 Depending on patient's symptomatology, this may be acute or chronic. 2. 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 Again, this may be acute or chronic. Chronic occlusion is favored given the background of significant peripheral arterial disease. 3. Significant left sided runoff disease. Patency of the runoff vessels to the ankle cannot be assessed due to poor contrast bolus from the multifocal left lower extremity occlusive disease. 4. On the right there is significant multifocal superficial femoral artery disease with multiple moderate and at least 1 high-grade stenosis. Additionally, there is significant proximal runoff disease on the right although the runoff vessels remain patent to the ankle. 5. At least 4 low-attenuation liver lesions concerning for metastatic disease. 6. Suspect osseous metastatic disease involving the medullary space of the bilateral proximal femoral diaphyses. Signed, Criselda Peaches, MD [facility]  [facility] Radiology Electronically Signed   By: Jacqulynn Cadet M.D.   On: 06/09/2015 21:19   US Biopsy  06/10/2015  CLINICAL DATA:  69-YEAR-OLD MALE WITH NEWLY DIAGNOSED WIDESPREAD METASTATIC DISEASE. 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 PATIENT HAS BILATERAL SUPRACLAVICULAR ADENOPATHY AND PRESENTS FOR ULTRASOUND-GUIDED CORE BIOPSY for tissue diagnosis. EXAM: ULTRASOUND GUIDED NEEDLE  BIOPSY LYMPH NODE COMPARISON:  None. PROCEDURE: Thyroid biopsy was thoroughly discussed with the patient and questions were answered. The benefits, risks, alternatives, and complications were also discussed. The patient understands and wishes to proceed with the procedure. Written consent was obtained. Ultrasound was performed to localize and mark an adequate site for the biopsy. Sonographically, the right supraclavicular adenopathy is more impressive and approachable for biopsy than the left adenopathy. The patient was then prepped and draped in a normal sterile fashion. Local anesthesia was provided with 1% lidocaine. Using direct ultrasound guidance, multiple passes were made using the Bard 18 gauge automated biopsy device into the right supraclavicular adenopathy. Ultrasound was used to confirm needle placements on all occasions. Specimens were sent to Pathology for analysis. COMPLICATIONS: None Estimated blood loss:  0 FINDINGS: Bilateral supraclavicular adenopathy more impressive on the right than the left. IMPRESSION: Ultrasound guided needle aspirate biopsy performed of the right supraclavicular adenopathy. Signed, Criselda Peaches, MD [facility] Radiology Electronically Signed   By: Jacqulynn Cadet M.D.   On: 06/10/2015 17:13    Scheduled Meds: . Barium Sulfate      . enoxaparin (LOVENOX) injection  40 mg Subcutaneous Q24H  . [START ON 06/12/2015] Influenza vac split quadrivalent PF  0.5 mL Intramuscular Tomorrow-1000   . levETIRAcetam  500 mg Oral BID  . levofloxacin (LEVAQUIN) IV  750 mg Intravenous Q48H  . pantoprazole  40 mg Oral Daily  . vancomycin  500 mg Intravenous Q12H   Continuous Infusions: . sodium chloride 100 mL/hr at 06/11/15 0027    Active Problems:   Seizure disorder, complex partial (HCC)   Metastatic primary lung  cancer ([place])   Limb ischemia   Leg pain   Lung cancer ([place])    Time spent: 35 minutes    Kelvin Cellar  [facility] Pager [phone]. If 7PM-7AM, please contact night-coverage at [email or web address], password [facility] 06/11/2015, 5:47 PM  LOS: 2 days

## 2015-06-12 ENCOUNTER — Other Ambulatory Visit: Payer: Self-pay | Admitting: Oncology

## 2015-06-12 DIAGNOSIS — C3492 Malignant neoplasm of unspecified part of left bronchus or lung: Secondary | ICD-10-CM

## 2015-06-12 DIAGNOSIS — C3432 Malignant neoplasm of lower lobe, left bronchus or lung: Secondary | ICD-10-CM | POA: Diagnosis not present

## 2015-06-12 LAB — BASIC METABOLIC PANEL
Anion gap: 11 (ref 5–15)
BUN: 16 mg/dL (ref 6–20)
CHLORIDE: 101 mmol/L (ref 101–111)
CO2: 26 mmol/L (ref 22–32)
Calcium: 9.9 mg/dL (ref 8.9–10.3)
Creatinine, Ser: 1.02 mg/dL (ref 0.61–1.24)
Glucose, Bld: 87 mg/dL (ref 65–99)
POTASSIUM: 3.8 mmol/L (ref 3.5–5.1)
SODIUM: 138 mmol/L (ref 135–145)

## 2015-06-12 LAB — CULTURE, BLOOD (ROUTINE X 2)

## 2015-06-12 LAB — CBC
HEMATOCRIT: 37.5 % — AB (ref 39.0–52.0)
Hemoglobin: 13 g/dL (ref 13.0–17.0)
MCH: 29.9 pg (ref 26.0–34.0)
MCHC: 34.7 g/dL (ref 30.0–36.0)
MCV: 86.2 fL (ref 78.0–100.0)
PLATELETS: 137 10*3/uL — AB (ref 150–400)
RBC: 4.35 MIL/uL (ref 4.22–5.81)
RDW: 12.3 % (ref 11.5–15.5)
WBC: 6.8 10*3/uL (ref 4.0–10.5)

## 2015-06-12 MED ORDER — OXYCODONE HCL 5 MG PO TABS: 5.0000 mg | ORAL_TABLET | ORAL | Status: DC | PRN

## 2015-06-12 NOTE — Progress Notes (Signed)
D/c to home via wc w/ family voices no c/o

## 2015-06-12 NOTE — Care Management Note (Signed)
Case Management Note  Patient Details  Name: Edwin Mckay MRN: [id number] Date of Birth: 1946-02-03  Subjective/Objective:       Patient is for dc today, wife chose [facility] for [facility], [place], aide and Education officer, museum.  Referral made to [facility] with [facility], soc will begin 24-48 hrs post dc.               Action/Plan:   Expected Discharge Date:                  Expected Discharge Plan:  [place]  In-House Referral:     Discharge planning Services  CM Consult  Post Acute Care Choice:  Home Health Choice offered to:  Spouse  DME Arranged:    DME Agency:     HH Arranged:  RN, PT, Social Work, Nurse's Aide [place] Agency:  [place]  Status of Service:  Completed, signed off  Medicare Important Message Given:    Date Medicare IM Given:    Medicare IM give by:    Date Additional Medicare IM Given:    Additional Medicare Important Message give by:     If discussed at [place] of Stay Meetings, dates discussed:    Additional Comments:  Zenon Mayo, RN 06/12/2015, 11:13 AM

## 2015-06-12 NOTE — Progress Notes (Signed)
CSW received consult for SNF placement at discharge.   Pt reported feeling safe to go home with his wife and two sons. Patient's brother was at bedside and agrees w/ patient that he will be safe at home.  Pt refuses SNF.  CSW signing off.  Percell Locus Cele Mote LCSWA [phone]

## 2015-06-12 NOTE — Discharge Summary (Signed)
Physician Discharge Summary  Edwin Mckay DXI:[id number] DOB: 1946/05/28 DOA: 06/09/2015  PCP: Morton Peters, MD  Admit date: 06/09/2015 Discharge date: 06/12/2015  Time spent: 35 minutes  Recommendations for Outpatient Follow-up:  1. Please follow-up on pathology report, patient undergoing ultrasound-guided needle biopsy of lymph node. Imaging studies revealed evidence of widespread metastatic malignancy, radiology reporting having characteristics of primary bronchogenic carcinoma 2. On day of discharge I spoke with Dr. Jana Hakim of medical oncology, who will notify Dr.Mohamed. He will be given follow-up appointment at the [facility].    Discharge Diagnoses:  Active Problems:   Seizure disorder, complex partial ([place])   Metastatic primary lung cancer ([place])   Limb ischemia   Leg pain   Lung cancer ([place])   Discharge Condition: Stable  Diet recommendation: Regular diet  Filed Weights   06/09/15 2315  Weight: 55.974 kg (123 lb 6.4 oz)    History of present illness:  Leevi Cullars is a 69 y.o. gentleman with a history of seizure who actually presented to the ED at [facility] almost one week ago for evaluation of left leg pain and weakness that has been present for several months, but worse over the past three weeks. DVT was ruled out and the patient was diagnosed with muscle spasm. He was given prescriptions for neurontin and valium and referred for outpatient follow-up. He comes to the the ED tonight complaining of continued leg pain and weakness. He has also developed a cough productive of clear sputum. No fevers, chills, or sweats. No chest pain or shortness of breath. No syncope or LOC. No dysuria. He admits that his appetite has been poor but he is not sure that he has lost weight. ED evalution concerning for LLL lung mass (8x6x9cm) with associated lymphadenopathy concerning for lung cancer (post-obstructive pneumonia could not be ruled out) and he  actually has chronic appearing peripheral vascular disease with complete occlusion of his left external iliac and superficial femoral arteries (milder disease is present on the right). He has been seen by vascular surgery, who says surgery is needed but no emergently because findings are not acute. Hospitalist asked to admit for further evaluation of lung mass.  Hospital Course:   Mr. Guice is a pleasant 69 year old gentleman with a past medical history of seizure disorder, peripheral arterial disease, who was admitted to the medicine service on 06/10/2015 when he is presented with complaints of left lower extremity pain. He had a CT scan of lungs performed on 06/09/2015 that revealed left lower lobe mass associate with extensive confluent hilar, mediastinal and supraclavicular adenopathy. Imaging also revealed evidence of hepatic metastatic disease. MRI of lumbar spine performed on 06/10/2015 showing widespread osseous metastatic disease as well as disc and facet degeneration with high-grade L4/5 canal stenosis. Radiology did not report evidence of pathologic fracture. Case was discussed with Dr. Jana Hakim of medical oncology for follow-up appointment at the cancer center.   1. Suspected metastatic lung cancer. -Mr. Hinde initially presented to the emergency department with complaints of left lower extremity pain. Imaging studies showing findings concerning for stage IV library bronchogenic carcinoma. -He has a history of tobacco abuse. -CT scan of abdomen and pelvis showing findings suggestive of metastatic disease involving liver. -MRI of lumbar spine showed widespread osseous metastatic disease. -On 06/10/2015 he underwent ultrasound-guided biopsy of bilateral supraclavicular adenopathy -Please follow-up on pathology report  2. Peripheral arterial disease. -He was seen and evaluated by Dr. Donnetta Hutching of vascular surgery for chronic lower extremity arterial insufficiency. Vascular surgery  did not  recommend urgent intervention at this time.  3. Back pain. -He was further assessed with MRI lumbar spine that revealed widespread osseous metastatic disease. Radiology did not report evidence of pathologic fracture. -There was disc and facet degeneration with high-grade L4/5 canal stenosis.  4. History of seizure disorder. -Continue Keppra 500 mg by mouth twice a day   Consultations:  Telephone Consultation to medical Oncology, spoke with Dr Jana Hakim  Discharge Exam: Filed Vitals:   06/11/15 0636 06/11/15 2223  BP: 146/83 142/75  Pulse: 89 87  Temp: 98.3 F (36.8 C) 99 F (37.2 C)  Resp: 20 18    General: No acute distress, cachectic, he is anxious to go home.  Cardiovascular: Regular rate and rhythm normal S1-S2 Respiratory: Diminished breath sounds bilaterally, scattered expiratory wheezes. Abdomen: Soft nontender nondistended Musculoskeletal: Sarcopenic   Discharge Instructions    Current Discharge Medication List    START taking these medications   Details  oxyCODONE (ROXICODONE) 5 MG immediate release tablet Take 1 tablet (5 mg total) by mouth every 4 (four) hours as needed for severe pain. Qty: 20 tablet, Refills: 0      CONTINUE these medications which have NOT CHANGED   Details  acetaminophen (TYLENOL) 500 MG tablet Take 1,000 mg by mouth every 6 (six) hours as needed for mild pain or headache.    diazepam (VALIUM) 5 MG tablet Take 1 tablet (5 mg total) by mouth every 6 (six) hours as needed for muscle spasms. Qty: 30 tablet, Refills: 0    gabapentin (NEURONTIN) 100 MG capsule Take 1 capsule (100 mg total) by mouth 3 (three) times daily. For 3 days, then increase to 2 tabs 3 times daily Qty: 30 capsule, Refills: 0    levETIRAcetam (KEPPRA) 500 MG tablet Take 1 tablet (500 mg total) by mouth 2 (two) times daily. Qty: 180 tablet, Refills: 3       No Known Allergies Follow-up Information    Follow up with Morton Peters, MD In 2 weeks.    Specialty:  Family Medicine   Contact information:   [address] [place] [address] [phone]       Follow up with Eilleen Kempf., MD In 1 week.   Specialty:  Oncology   Contact information:   [address] [place] [address] [phone]        The results of significant diagnostics from this hospitalization (including imaging, microbiology, ancillary and laboratory) are listed below for reference.    Significant Diagnostic Studies: Dg Chest 2 View  06/09/2015  CLINICAL DATA:  Productive cough EXAM: CHEST  2 VIEW COMPARISON:  June 01, 2015 FINDINGS: There remains extensive airspace consolidation in the superior segment of the left lower lobe. This finding is stable compared to recent prior study. No new parenchymal lung opacity identified. There is extensive hilar and mediastinal adenopathy, stable from recent prior study. Heart size and pulmonary vascularity normal.  No bone lesions. IMPRESSION: Extensive airspace consolidation in the superior segment of the left lower lobe remains. Widespread adenopathy is also present. Given this adenopathy, contrast enhanced chest CT remains warranted, both to better delineate the adenopathy as well as to assess for possible endobronchial lesion in the superior segment left lower lobe region. Electronically Signed   By: Lowella Grip III M.D.   On: 06/09/2015 18:00   Dg Chest 2 View  06/01/2015  CLINICAL DATA:  Shortness of breath and chronic cough EXAM: CHEST  2 VIEW COMPARISON:  None.  FINDINGS: There is airspace consolidation in the superior segment of the left lower lobe consistent with pneumonia. There is a probable nipple shadow at the left base. There is no edema or consolidation on the right. There is extensive adenopathy in the hilar regions as well as in the azygos, aortopulmonary window, and inferior paratracheal regions. Heart size and pulmonary vascularity are normal. No bone lesions are apparent. IMPRESSION:  Extensive consolidation in the superior segment left lower lobe. This finding is consistent with pneumonia. The possibility of an endobronchial lesion with postobstructive pneumonitis is a possibility, particularly given the adenopathy present. Extensive mediastinal and hilar adenopathy. Neoplasm is certainly suspected. Given this circumstance, contrast enhanced chest CT is advised. Electronically Signed   By: Lowella Grip III M.D.   On: 06/01/2015 11:15   Ct Chest W Contrast  06/09/2015  CLINICAL DATA:  69 year old male with shortness of breath, pulseless left leg and mass on aorta EXAM: CT ANGIOGRAPHY OF CHEST, ABDOMEN AND PELVIS WITH ILIOFEMORAL RUNOFF TECHNIQUE: Multidetector CT imaging of the abdomen, pelvis and lower extremities was performed using the standard protocol during bolus administration of intravenous contrast. Multiplanar CT image reconstructions and MIPs were obtained to evaluate the vascular anatomy. CONTRAST:  161m OMNIPAQUE IOHEXOL 350 MG/ML SOLN COMPARISON:  Prior chest x-ray 06/09/2015 FINDINGS: CTA CHEST Mediastinum: Bilateral left slightly greater than right supraclavicular adenopathy with the largest nodes measuring 15 mm in short axis on the left. There is extensive confluent adenopathy throughout the mediastinum and both hila. Measurement of individual index nodes is difficult given the confluence of the adenopathy. However, the dominant right paratracheal node measures 28 x 34 cm in greatest diameter. The dominant subcarinal node measures approximately 34 x 44 mm. The prevascular nodal conglomerate measures approximately 27 by 48 mm at the common origin of the right brachiocephalic and left common carotid arteries. Heart/Vascular: 2 vessel aortic arch. The right brachiocephalic and left common carotid arteries share a common origin. Scattered atherosclerotic plaque throughout the aorta. No aneurysm or dissection. Heterogeneous and irregular fibro fatty plaque and possible wall  adherent mural thrombus. Calcifications are present throughout the coronary arteries. The heart is normal limits in size. No pericardial effusion. Lungs/Pleura: Confluent adenopathy from the mediastinum and left hilum extends in a peribronchovascular distribution along the right upper lobe and right lower lobe bronchi and pulmonary arteries. There is a 1.7 cm round area of low attenuation within the resultant macro lobulated left lower lobe mass consistent with an area of internal necrosis. Precise measurements of the lung mass are challenging giving its extent and a amorphous shape. The lesion measures approximately 8.0 by 6.3 by 9.1 cm. There is extensive ground-glass attenuation opacity throughout the remainder of the left lower lobe as well as nodular thickening along the major fissure consistent with lipid ache growth and interstitial spread of tumor. Multiple nodular metastases are present within the left lower lobe measuring up to 12 mm. Additional pulmonary nodules are noted in the left upper lobe measuring up to 4 mm concerning for distant metastases. Nonspecific subpleural nodularity present within the right upper lobe. There is a background of centrilobular pulmonary emphysema. Bones/Soft Tissues: No acute fracture or aggressive appearing lytic or blastic osseous lesion. CTA ABD/PELVIS WITH RUNOFF VASCULAR Aorta: Irregular heterogeneous atherosclerotic plaque throughout the abdominal aorta. No aneurysm. Celiac: Mild narrowing at the origin.  No visceral artery aneurysm. SMA: Mild narrowing at the origin. Accessory right hepatic artery from the SMA. No aneurysm or significant stenosis. Renals: Multiple (4) left-sided renal arteries.  Two right renal arteries. No significant stenosis identified on either side. No evidence of dissection or renal artery aneurysm. No changes of fibromuscular dysplasia. IMA: At least moderate narrowing at the origin. The vessel remains patent distally. RIGHT Lower Extremity  Inflow: On the right, there is modest narrowing of the origin of the internal iliac artery. The common iliac artery is diseased but remains patent and without significant stenosis. Outflow: Fibro fatty plaque in the common femoral artery without significant stenosis. The profunda femoral artery is patent. The superficial femoral artery is diffusely diseased with multifocal areas of moderate and high-grade stenosis. There may be a focal short segment occlusion versus critical stenosis in the adductor canal. The popliteal artery is diseased but not significantly narrowed. Runoff: High-grade stenosis of the origin of the anterior tibial artery an within the distal tibioperoneal trunk. The anterior tibial artery becomes tiny at the ankle but there does appear to be three-vessel runoff to the ankle. LEFT lower Extremity Inflow: Heterogeneous and irregular atherosclerotic plaque in the bilateral common iliac arteries with at least moderate narrowing on the left. There is flush occlusion of the left external iliac artery beginning at the origin. High-grade stenosis at the origin of the left internal iliac artery but the distal branches remain patent. The distal left external iliac artery reconstitutes via retrograde flow from the common femoral artery. Outflow: Heterogeneous fibro fatty plaque with moderate narrowing throughout the common femoral artery. There is moderate narrowing at the origin of the profunda femoral artery. The superficial femoral artery appears chronically occluded just beyond its origin but reconstitutes in the adductor canal secondary to geniculate collaterals. The popliteal artery is diseased and mildly narrowed but patent. Runoff: Limited evaluation of the runoff arteries secondary to decreased contrast bolus and calcified plaque. There appears to be significant stenosis at the origin of the anterior tibial artery and throughout the tibioperoneal trunk. Patency of the runoff vessels below the  tibioperoneal trunk is not well assessed by CTA. Veins: No focal venous abnormality. Review of the MIP images confirms the above findings. NON-VASCULAR Abdomen: Unremarkable CT appearance of the stomach, duodenum, spleen, adrenal glands and pancreas. Normal hepatic contour and morphology. There are several at least for a subtle low-attenuation lesions in the right hemi liver concerning for metastatic disease. The largest located posteriorly measures 1.8 cm but may represent a region of diaphragmatic infolding. The second largest lesion measures 17 mm in the anterior aspect of hepatic segment 5. The gallbladder is surgically absent. No intra or extrahepatic biliary ductal dilatation. Unremarkable appearance of the bilateral kidneys. No focal solid lesion, hydronephrosis or nephrolithiasis. No evidence of obstruction or focal bowel wall thickening. Normal appendix in the right lower quadrant. The terminal ileum is unremarkable. No free fluid or suspicious abdominal adenopathy. Pelvis: Mild prostatomegaly. Unremarkable appearance of the bladder and seminal vesicles. No free fluid or suspicious adenopathy. Bones/Soft Tissues: Soft tissue density within the marrow space of the proximal femurs bilaterally concerning for areas of possible osseous metastasis. No other definitive lytic or blastic osseous lesions. IMPRESSION: CTA CHEST 1. 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 The dominant mass appears to be within the left lower lobe and there is extensive confluent bi hilar, mediastinal and bilateral supraclavicular adenopathy as well as evidence of interstitial spread of tumor throughout the left lower lobe with extension into the pleural space of the major fissure. Additionally, there are likely metastatic implants within the left upper lobe  parenchyma. For tissue diagnosis, consider ultrasound-guided biopsy of the  supraclavicular adenopathy. 2. Small low-attenuation lesions within liver are concerning for hepatic metastatic disease. 3. Round soft tissue density implants within the marrow space of the bilateral proximal femoral diaphyses concerning for osseous metastatic disease. 4. Heterogeneous and irregular atherosclerotic plaque throughout the thoracic aorta. No aneurysm or dissection. 5. Coronary artery calcifications. 6. Centrilobular pulmonary emphysema. CTA ABD/PELVIS WITH RUNOFF 1. 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 Depending on patient's symptomatology, this may be acute or chronic. 2. 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 Again, this may be acute or chronic. Chronic occlusion is favored given the background of significant peripheral arterial disease. 3. Significant left sided runoff disease. Patency of the runoff vessels to the ankle cannot be assessed due to poor contrast bolus from the multifocal left lower extremity occlusive disease. 4. On the right there is significant multifocal superficial femoral artery disease with multiple moderate and at least 1 high-grade stenosis. Additionally, there is significant proximal runoff disease on the right although the runoff vessels remain patent to the ankle. 5. At least 4 low-attenuation liver lesions concerning for metastatic disease. 6. Suspect osseous metastatic disease involving the medullary space of the bilateral proximal femoral diaphyses. Signed, Criselda Peaches, MD [facility] Rad[facility] Electronically Signed   By: Jacqulynn Cadet M.D.   On: 06/09/2015 21:19   Mr Lumbar Spine Wo Contrast  06/10/2015  CLINICAL DATA:  Back pain. EXAM: MRI LUMBAR SPINE WITHOUT CONTRAST TECHNIQUE: Multiplanar, multisequence MR imaging of the lumbar spine  was performed. No intravenous contrast was administered. COMPARISON:  None. FINDINGS: Each vertebra and visualized pelvis has infiltrating marrow signal abnormality consistent with metastatic disease. No superimposed spinal pathologic fracture. There is unusual edematous signal symmetrically in the upper gluteal musculature and right more than left iliacus without visualized pathologic fracture plane either by MR or preceding CT. No visualized canal or foraminal infiltration of tumor. Normal conus signal and morphology. Degenerative changes: T12- L1: Disc: No herniation. Facets: Negative. Canal: Patent. Foramina: No impingement. L1-L2: Disc: Disc bulging and spondylotic spurring. Facets: Negative. Canal: Ventral sac flattening without impingement Foramina: No impingement. L2-L3: Disc: Bulging with superimposed right paracentral to foraminal disc protrusion. Facets: Arthropathy with mild overgrowth. Canal: Ventral thecal sac flattening and partial right subarticular recess effacement. Foramina: No impingement. L3-L4: Disc: Bulging with probable bilateral paracentral protrusion based on sagittal sequences. Facets: Arthropathy with overgrowth Canal: Moderate triangular deformation without definite impingement. Foramina: No impingement. L4-L5: Disc: Circumferential disc bulging Facets: Arthropathy with bony overgrowth and ligamentum flavum thickening. Canal: High-grade stenosis with complete CSF effacement Foramina: Moderate left stenosis with near complete perineural fat effacement. Right stenosis is mild. L5-S1: Disc:  No significant finding. Facets: Negative. Canal: Patent. Foramina: No impingement. IMPRESSION: 1. Widespread osseous metastatic disease. 2. Myopathic changes around the posterior pelvis tha tis likely reactive to #1. No discrete pathologic fracture identified. 3. Disc and facet degeneration with high-grade L4-5 canal stenosis. Electronically Signed   By: Monte Fantasia M.D.   On: 06/10/2015 21:25    Ct Angio Ao+bifem W/cm &/or Wo/cm  06/09/2015  CLINICAL DATA:  69 year old male with shortness of breath, pulseless left leg and mass on aorta EXAM: CT ANGIOGRAPHY OF CHEST, ABDOMEN AND PELVIS WITH ILIOFEMORAL RUNOFF TECHNIQUE: Multidetector CT imaging of the abdomen, pelvis and lower extremities was performed using the  standard protocol during bolus administration of intravenous contrast. Multiplanar CT image reconstructions and MIPs were obtained to evaluate the vascular anatomy. CONTRAST:  173m OMNIPAQUE IOHEXOL 350 MG/ML SOLN COMPARISON:  Prior chest x-ray 06/09/2015 FINDINGS: CTA CHEST Mediastinum: Bilateral left slightly greater than right supraclavicular adenopathy with the largest nodes measuring 15 mm in short axis on the left. There is extensive confluent adenopathy throughout the mediastinum and both hila. Measurement of individual index nodes is difficult given the confluence of the adenopathy. However, the dominant right paratracheal node measures 28 x 34 cm in greatest diameter. The dominant subcarinal node measures approximately 34 x 44 mm. The prevascular nodal conglomerate measures approximately 27 by 48 mm at the common origin of the right brachiocephalic and left common carotid arteries. Heart/Vascular: 2 vessel aortic arch. The right brachiocephalic and left common carotid arteries share a common origin. Scattered atherosclerotic plaque throughout the aorta. No aneurysm or dissection. Heterogeneous and irregular fibro fatty plaque and possible wall adherent mural thrombus. Calcifications are present throughout the coronary arteries. The heart is normal limits in size. No pericardial effusion. Lungs/Pleura: Confluent adenopathy from the mediastinum and left hilum extends in a peribronchovascular distribution along the right upper lobe and right lower lobe bronchi and pulmonary arteries. There is a 1.7 cm round area of low attenuation within the resultant macro lobulated left lower lobe mass  consistent with an area of internal necrosis. Precise measurements of the lung mass are challenging giving its extent and a amorphous shape. The lesion measures approximately 8.0 by 6.3 by 9.1 cm. There is extensive ground-glass attenuation opacity throughout the remainder of the left lower lobe as well as nodular thickening along the major fissure consistent with lipid ache growth and interstitial spread of tumor. Multiple nodular metastases are present within the left lower lobe measuring up to 12 mm. Additional pulmonary nodules are noted in the left upper lobe measuring up to 4 mm concerning for distant metastases. Nonspecific subpleural nodularity present within the right upper lobe. There is a background of centrilobular pulmonary emphysema. Bones/Soft Tissues: No acute fracture or aggressive appearing lytic or blastic osseous lesion. CTA ABD/PELVIS WITH RUNOFF VASCULAR Aorta: Irregular heterogeneous atherosclerotic plaque throughout the abdominal aorta. No aneurysm. Celiac: Mild narrowing at the origin.  No visceral artery aneurysm. SMA: Mild narrowing at the origin. Accessory right hepatic artery from the SMA. No aneurysm or significant stenosis. Renals: Multiple (4) left-sided renal arteries. Two right renal arteries. No significant stenosis identified on either side. No evidence of dissection or renal artery aneurysm. No changes of fibromuscular dysplasia. IMA: At least moderate narrowing at the origin. The vessel remains patent distally. RIGHT Lower Extremity Inflow: On the right, there is modest narrowing of the origin of the internal iliac artery. The common iliac artery is diseased but remains patent and without significant stenosis. Outflow: Fibro fatty plaque in the common femoral artery without significant stenosis. The profunda femoral artery is patent. The superficial femoral artery is diffusely diseased with multifocal areas of moderate and high-grade stenosis. There may be a focal short segment  occlusion versus critical stenosis in the adductor canal. The popliteal artery is diseased but not significantly narrowed. Runoff: High-grade stenosis of the origin of the anterior tibial artery an within the distal tibioperoneal trunk. The anterior tibial artery becomes tiny at the ankle but there does appear to be three-vessel runoff to the ankle. LEFT lower Extremity Inflow: Heterogeneous and irregular atherosclerotic plaque in the bilateral common iliac arteries with at least moderate narrowing on the  left. There is flush occlusion of the left external iliac artery beginning at the origin. High-grade stenosis at the origin of the left internal iliac artery but the distal branches remain patent. The distal left external iliac artery reconstitutes via retrograde flow from the common femoral artery. Outflow: Heterogeneous fibro fatty plaque with moderate narrowing throughout the common femoral artery. There is moderate narrowing at the origin of the profunda femoral artery. The superficial femoral artery appears chronically occluded just beyond its origin but reconstitutes in the adductor canal secondary to geniculate collaterals. The popliteal artery is diseased and mildly narrowed but patent. Runoff: Limited evaluation of the runoff arteries secondary to decreased contrast bolus and calcified plaque. There appears to be significant stenosis at the origin of the anterior tibial artery and throughout the tibioperoneal trunk. Patency of the runoff vessels below the tibioperoneal trunk is not well assessed by CTA. Veins: No focal venous abnormality. Review of the MIP images confirms the above findings. NON-VASCULAR Abdomen: Unremarkable CT appearance of the stomach, duodenum, spleen, adrenal glands and pancreas. Normal hepatic contour and morphology. There are several at least for a subtle low-attenuation lesions in the right hemi liver concerning for metastatic disease. The largest located posteriorly measures 1.8  cm but may represent a region of diaphragmatic infolding. The second largest lesion measures 17 mm in the anterior aspect of hepatic segment 5. The gallbladder is surgically absent. No intra or extrahepatic biliary ductal dilatation. Unremarkable appearance of the bilateral kidneys. No focal solid lesion, hydronephrosis or nephrolithiasis. No evidence of obstruction or focal bowel wall thickening. Normal appendix in the right lower quadrant. The terminal ileum is unremarkable. No free fluid or suspicious abdominal adenopathy. Pelvis: Mild prostatomegaly. Unremarkable appearance of the bladder and seminal vesicles. No free fluid or suspicious adenopathy. Bones/Soft Tissues: Soft tissue density within the marrow space of the proximal femurs bilaterally concerning for areas of possible osseous metastasis. No other definitive lytic or blastic osseous lesions. IMPRESSION: CTA CHEST 1. 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 The dominant mass appears to be within the left lower lobe and there is extensive confluent bi hilar, mediastinal and bilateral supraclavicular adenopathy as well as evidence of interstitial spread of tumor throughout the left lower lobe with extension into the pleural space of the major fissure. Additionally, there are likely metastatic implants within the left upper lobe parenchyma. For tissue diagnosis, consider ultrasound-guided biopsy of the supraclavicular adenopathy. 2. Small low-attenuation lesions within liver are concerning for hepatic metastatic disease. 3. Round soft tissue density implants within the marrow space of the bilateral proximal femoral diaphyses concerning for osseous metastatic disease. 4. Heterogeneous and irregular atherosclerotic plaque throughout the thoracic aorta. No aneurysm or dissection. 5. Coronary artery calcifications. 6. Centrilobular pulmonary emphysema. CTA ABD/PELVIS  WITH RUNOFF 1. 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 Depending on patient's symptomatology, this may be acute or chronic. 2. 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 Again, this may be acute or chronic. Chronic occlusion is favored given the background of significant peripheral arterial disease. 3. Significant left sided runoff disease. Patency of the runoff vessels to the ankle cannot be assessed due to poor contrast bolus from the multifocal left lower extremity occlusive disease. 4. On the right there is significant multifocal superficial femoral artery disease with multiple moderate and at least  1 high-grade stenosis. Additionally, there is significant proximal runoff disease on the right although the runoff vessels remain patent to the ankle. 5. At least 4 low-attenuation liver lesions concerning for metastatic disease. 6. Suspect osseous metastatic disease involving the medullary space of the bilateral proximal femoral diaphyses. Signed, Criselda Peaches, MD [facility] Radiology Electronically Signed   By: Jacqulynn Cadet M.D.   On: 06/09/2015 21:19   US Biopsy  06/10/2015  CLINICAL DATA:  19-YEAR-OLD MALE WITH NEWLY DIAGNOSED WIDESPREAD METASTATIC DISEASE. 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 PATIENT HAS BILATERAL SUPRACLAVICULAR ADENOPATHY AND PRESENTS FOR ULTRASOUND-GUIDED CORE BIOPSY for tissue diagnosis. EXAM: ULTRASOUND GUIDED NEEDLE  BIOPSY LYMPH NODE COMPARISON:  None. PROCEDURE: Thyroid biopsy was thoroughly discussed with the patient and questions were answered. The benefits, risks, alternatives, and complications were also discussed. The patient understands and wishes to proceed with the procedure. Written consent was obtained.  Ultrasound was performed to localize and mark an adequate site for the biopsy. Sonographically, the right supraclavicular adenopathy is more impressive and approachable for biopsy than the left adenopathy. The patient was then prepped and draped in a normal sterile fashion. Local anesthesia was provided with 1% lidocaine. Using direct ultrasound guidance, multiple passes were made using the Bard 18 gauge automated biopsy device into the right supraclavicular adenopathy. Ultrasound was used to confirm needle placements on all occasions. Specimens were sent to Pathology for analysis. COMPLICATIONS: None Estimated blood loss:  0 FINDINGS: Bilateral supraclavicular adenopathy more impressive on the right than the left. IMPRESSION: Ultrasound guided needle aspirate biopsy performed of the right supraclavicular adenopathy. Signed, Criselda Peaches, MD [facility] Radiology Electronically Signed   By: Jacqulynn Cadet M.D.   On: 06/10/2015 17:13   US Venous Img Lower Unilateral Left  06/01/2015  CLINICAL DATA:  69 year old with 1 week history of left lower leg pain. EXAM: LEFT LOWER EXTREMITY VENOUS DOPPLER ULTRASOUND TECHNIQUE: Gray-scale sonography with graded compression, as well as color Doppler and duplex ultrasound were performed to evaluate the lower extremity deep venous systems from the level of the common femoral vein and including the common femoral, femoral, profunda femoral, popliteal and calf veins including the posterior tibial, peroneal and gastrocnemius veins when visible. The superficial great saphenous vein was also interrogated. Spectral Doppler was utilized to evaluate flow at rest and with distal augmentation maneuvers in the common femoral, femoral and popliteal veins. COMPARISON:  None. FINDINGS: Contralateral Common Femoral Vein: Respiratory phasicity is normal and symmetric with the symptomatic side. No evidence of thrombus. Normal  compressibility. Common Femoral Vein: No evidence of thrombus. Normal compressibility, respiratory phasicity and response to augmentation. Saphenofemoral Junction: No evidence of thrombus. Normal compressibility and flow on color Doppler imaging. Profunda Femoral Vein: No evidence of thrombus. Normal compressibility and flow on color Doppler imaging. Femoral Vein: No evidence of thrombus. Normal compressibility, respiratory phasicity and response to augmentation. Popliteal Vein: No evidence of thrombus. Normal compressibility, respiratory phasicity and response to augmentation. Calf Veins: No evidence of thrombus. Normal compressibility and flow on color Doppler imaging. Superficial Great Saphenous Vein: No evidence of thrombus. Normal compressibility and flow on color Doppler imaging. Venous Reflux:  Not evaluated. Other Findings:  None. IMPRESSION: No evidence of left lower extremity DVT. Electronically Signed   By: Evangeline Dakin M.D.   On: 06/01/2015 11:50    Microbiology: Recent Results (from the past 240 hour(s))  Blood culture (routine x 2)  Status: None (Preliminary result)   Collection Time: 06/09/15  9:33 PM  Result Value Ref Range Status   Specimen Description BLOOD RIGHT ANTECUBITAL  Final   Special Requests BOTTLES DRAWN AEROBIC AND ANAEROBIC 5CC  Final   Culture  Setup Time   Final    GRAM POSITIVE COCCI IN CLUSTERS AEROBIC BOTTLE ONLY CRITICAL RESULT CALLED TO, READ BACK BY AND VERIFIED WITH: A JOHNSON RN [id number] 06/10/15 A BROWNING    Culture CULTURE REINCUBATED FOR BETTER GROWTH  Final   Report Status PENDING  Incomplete  Blood culture (routine x 2)     Status: None (Preliminary result)   Collection Time: 06/09/15  9:35 PM  Result Value Ref Range Status   Specimen Description BLOOD LEFT ANTECUBITAL  Final   Special Requests BOTTLES DRAWN AEROBIC AND ANAEROBIC 4CC  Final   Culture NO GROWTH 2 DAYS  Final   Report Status PENDING  Incomplete  MRSA PCR Screening     Status:  None   Collection Time: 06/10/15  6:21 PM  Result Value Ref Range Status   MRSA by PCR NEGATIVE NEGATIVE Final    Comment:        The GeneXpert MRSA Assay (FDA approved for NASAL specimens only), is one component of a comprehensive MRSA colonization surveillance program. It is not intended to diagnose MRSA infection nor to guide or monitor treatment for MRSA infections.      Labs: Basic Metabolic Panel:  Recent Labs Lab 06/09/15 1808 06/09/15 2144 06/10/15 0700 06/11/15 0600 06/12/15 0810  NA 136 134* 136 135 138  K 4.2 3.7 3.9 4.2 3.8  CL 95* 95* 98* 99* 101  CO2 30  --  '26 25 26  '$ GLUCOSE 114* 101* 88 71 87  BUN 23* 23* '19 20 16  '$ CREATININE 1.29* 1.20 1.30* 1.27* 1.02  CALCIUM 10.5*  --  10.4* 10.1 9.9   Liver Function Tests:  Recent Labs Lab 06/09/15 1808 06/10/15 0700 06/11/15 0600  AST 59* 56* 55*  ALT 34 29 25  ALKPHOS 173* 147* 128*  BILITOT 0.6 0.9 1.0  PROT 6.8 6.0* 5.9*  ALBUMIN 2.5* 2.2* 2.1*   No results for input(s): LIPASE, AMYLASE in the last 168 hours. No results for input(s): AMMONIA in the last 168 hours. CBC:  Recent Labs Lab 06/09/15 1808 06/09/15 2144 06/10/15 0700 06/11/15 0600 06/12/15 0810  WBC 6.8  --  6.5 6.7 6.8  NEUTROABS 4.7  --   --   --   --   HGB 12.9* 13.6 12.0* 12.5* 13.0  HCT 38.1* 40.0 35.6* 36.6* 37.5*  MCV 88.0  --  88.1 87.4 86.2  PLT 132*  --  133* 133* 137*   Cardiac Enzymes: No results for input(s): CKTOTAL, CKMB, CKMBINDEX, TROPONINI in the last 168 hours. BNP: BNP (last 3 results) No results for input(s): BNP in the last 8760 hours.  ProBNP (last 3 results) No results for input(s): PROBNP in the last 8760 hours.  CBG: No results for input(s): GLUCAP in the last 168 hours.     Signed:  Kelvin Cellar MD  FACP  [facility] 06/12/2015, 10:37 AM

## 2015-06-13 ENCOUNTER — Telehealth: Payer: Self-pay | Admitting: *Deleted

## 2015-06-13 DIAGNOSIS — C3492 Malignant neoplasm of unspecified part of left bronchus or lung: Secondary | ICD-10-CM

## 2015-06-13 NOTE — Telephone Encounter (Signed)
Oncology Nurse Navigator Documentation  Oncology Nurse Navigator Flowsheets 06/13/2015  Navigator Encounter Type Introductory phone call/I received referral.  I called and scheduled patient to be seen on 06/25/15 arrive at 1:45 with labs at 2:00 and see Dr. Julien Nordmann at 2:15.  Patient aware of appt time and place.   Patient Visit Type Initial  Treatment Phase Abnormal Scans  Interventions Coordination of Care  Coordination of Care MD Appointments  Time Spent with Patient 15

## 2015-06-13 NOTE — Telephone Encounter (Signed)
Oncology Nurse Navigator Documentation  Oncology Nurse Navigator Flowsheets 06/13/2015  Navigator Encounter Type Telephone/Dr. Julien Nordmann asked that I change [name] appt to 06/20/15 arrive at 8:30.  I called patient and left a vm message of appt change.  I also left my name and phone number to call if needed.   Patient Visit Type Follow-up  Treatment Phase Abnormal Scans  Interventions Coordination of Care  Coordination of Care MD Appointments  Time Spent with Patient 15

## 2015-06-14 LAB — CULTURE, BLOOD (ROUTINE X 2): Culture: NO GROWTH

## 2015-06-20 ENCOUNTER — Ambulatory Visit (HOSPITAL_BASED_OUTPATIENT_CLINIC_OR_DEPARTMENT_OTHER): Payer: Medicare Other | Admitting: Internal Medicine

## 2015-06-20 ENCOUNTER — Other Ambulatory Visit (HOSPITAL_BASED_OUTPATIENT_CLINIC_OR_DEPARTMENT_OTHER): Payer: Medicare Other

## 2015-06-20 ENCOUNTER — Encounter: Payer: Self-pay | Admitting: Internal Medicine

## 2015-06-20 ENCOUNTER — Telehealth: Payer: Self-pay | Admitting: Internal Medicine

## 2015-06-20 VITALS — BP 87/66 | HR 88 | Temp 98.0°F | Resp 19 | Wt 118.3 lb

## 2015-06-20 DIAGNOSIS — R634 Abnormal weight loss: Secondary | ICD-10-CM

## 2015-06-20 DIAGNOSIS — C7951 Secondary malignant neoplasm of bone: Secondary | ICD-10-CM | POA: Diagnosis not present

## 2015-06-20 DIAGNOSIS — C3492 Malignant neoplasm of unspecified part of left bronchus or lung: Secondary | ICD-10-CM

## 2015-06-20 DIAGNOSIS — R531 Weakness: Secondary | ICD-10-CM

## 2015-06-20 DIAGNOSIS — G893 Neoplasm related pain (acute) (chronic): Secondary | ICD-10-CM

## 2015-06-20 DIAGNOSIS — C778 Secondary and unspecified malignant neoplasm of lymph nodes of multiple regions: Secondary | ICD-10-CM

## 2015-06-20 DIAGNOSIS — Z720 Tobacco use: Secondary | ICD-10-CM

## 2015-06-20 DIAGNOSIS — C3432 Malignant neoplasm of lower lobe, left bronchus or lung: Secondary | ICD-10-CM

## 2015-06-20 DIAGNOSIS — C787 Secondary malignant neoplasm of liver and intrahepatic bile duct: Secondary | ICD-10-CM

## 2015-06-20 DIAGNOSIS — R5383 Other fatigue: Secondary | ICD-10-CM

## 2015-06-20 HISTORY — DX: Malignant neoplasm of unspecified part of left bronchus or lung: C34.92

## 2015-06-20 LAB — CBC WITH DIFFERENTIAL/PLATELET
BASO%: 0.4 % (ref 0.0–2.0)
BASOS ABS: 0 10*3/uL (ref 0.0–0.1)
EOS ABS: 0 10*3/uL (ref 0.0–0.5)
EOS%: 0.1 % (ref 0.0–7.0)
HCT: 40.6 % (ref 38.4–49.9)
HGB: 13.9 g/dL (ref 13.0–17.1)
LYMPH%: 26.8 % (ref 14.0–49.0)
MCH: 29.8 pg (ref 27.2–33.4)
MCHC: 34.2 g/dL (ref 32.0–36.0)
MCV: 86.9 fL (ref 79.3–98.0)
MONO#: 0.3 10*3/uL (ref 0.1–0.9)
MONO%: 4.1 % (ref 0.0–14.0)
NEUT#: 5.7 10*3/uL (ref 1.5–6.5)
NEUT%: 68.6 % (ref 39.0–75.0)
PLATELETS: 131 10*3/uL — AB (ref 140–400)
RBC: 4.67 10*6/uL (ref 4.20–5.82)
RDW: 12.7 % (ref 11.0–14.6)
WBC: 8.4 10*3/uL (ref 4.0–10.3)
lymph#: 2.2 10*3/uL (ref 0.9–3.3)
nRBC: 1 % — ABNORMAL HIGH (ref 0–0)

## 2015-06-20 LAB — COMPREHENSIVE METABOLIC PANEL
ALT: 20 U/L (ref 0–55)
ANION GAP: 15 meq/L — AB (ref 3–11)
AST: 70 U/L — ABNORMAL HIGH (ref 5–34)
Albumin: 2.7 g/dL — ABNORMAL LOW (ref 3.5–5.0)
Alkaline Phosphatase: 175 U/L — ABNORMAL HIGH (ref 40–150)
BILIRUBIN TOTAL: 0.57 mg/dL (ref 0.20–1.20)
BUN: 36.6 mg/dL — ABNORMAL HIGH (ref 7.0–26.0)
CHLORIDE: 98 meq/L (ref 98–109)
CO2: 25 meq/L (ref 22–29)
Calcium: 11.6 mg/dL — ABNORMAL HIGH (ref 8.4–10.4)
Creatinine: 1.6 mg/dL — ABNORMAL HIGH (ref 0.7–1.3)
EGFR: 44 mL/min/{1.73_m2} — AB (ref >90)
Glucose: 235 mg/dl — ABNORMAL HIGH (ref 70–140)
Potassium: 4.5 mEq/L (ref 3.5–5.1)
Sodium: 138 mEq/L (ref 136–145)
Total Protein: 7.5 g/dL (ref 6.4–8.3)

## 2015-06-20 MED ORDER — OXYCODONE HCL 5 MG PO TABS: 5.0000 mg | ORAL_TABLET | ORAL | Status: DC | PRN

## 2015-06-20 NOTE — Progress Notes (Signed)
[place] [phone]   [phone]  CONSULT NOTE  REFERRING PHYSICIAN: Kelvin Cellar, MD   REASON FOR CONSULTATION:  69 years old white male recently diagnosed with lung cancer.  HPI Edwin Mckay is a 69 y.o. male with long history of smoking and past medical history significant for seizure and peripheral vascular disease. The patient has been complaining of back pain especially when laying down with radiation to the leg and weakness and significant weight loss over the last few months. He was seen at [facility] initially and has few investigation performed and was informed that no concerning abnormalities. His wife brought him to the emergency department at [facility] and on 06/01/2015 he had chest x-ray which showed extensive consolidation in the superior segment of the left lower lobe concerning for pneumonia but the possibility of an endobronchial lesion with postobstructive pneumonitis is also a possibility. There was extensive mediastinal and hilar adenopathy and neoplasm is suspected. CT angiogram of the chest, abdomen and pelvis were performed on 06/09/2015 and it showed Confluent adenopathy from the mediastinum and left hilum extends in a peribronchovascular distribution along the right upper lobe and right lower lobe bronchi and pulmonary arteries. There is a 1.7 cm round area of low attenuation within the resultant macro lobulated left lower lobe mass consistent with an area of internal necrosis. Precise measurements of the lung mass are challenging giving its extent and a amorphous shape. The lesion measures approximately 8.0 by 6.3 by 9.1 cm. There is extensive ground-glass attenuation opacity throughout the remainder of the left lower lobe as well as nodular thickening along the major fissure consistent with lipid ache growth and interstitial spread of tumor. Multiple nodular metastases are present within the left lower  lobe measuring up to 12 mm. Additional pulmonary nodules are noted in the left upper lobe measuring up to 4 mm concerning for distant metastases. Nonspecific subpleural nodularity present within the right upper lobe. Bilateral left slightly greater than right supraclavicular adenopathy with the largest nodes measuring 15 mm in short axis on the left. There is extensive confluent adenopathy throughout the mediastinum and both hila. Measurement of individual index nodes is difficult given the confluence of the adenopathy. However, the dominant right paratracheal node measures 28 x 34 cm in greatest diameter. The dominant subcarinal node measures approximately 34 x 44 mm. The prevascular nodal conglomerate measures approximately 27 by 48 mm at the common origin of the rightbrachiocephalic and left common carotid arteries. At least 4 low-attenuation liver lesions concerning for metastatic disease. Suspect osseous metastatic disease involving the medullary space of the bilateral proximal femoral diaphyses. On 06/10/2015 the patient underwent ultrasound-guided needle biopsy of the right supraclavicular lymph node.  The final pathology (Accession: [id number]) showed a small cell carcinoma. The majority of the tissue is necrotic with focal ares of malignant cells with high N/C ratios and neuroendocrine chromatin. Definitive residual lymph node is not seen. Immunohistochemistry reveals the tumor cells are positive for CD56 and synaptophysin. They are negative for cytokeratin 5/6, p63 (weak blush staining), TTF-1, and CD45. 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 Because of the persistent back pain the patient had MRI of the lumbar spine performed on 06/10/2015 and it showed widespread osseous metastatic disease. The patient was referred to me today for further evaluation and recommendation regarding treatment of his condition. When seen today he continues to complain of significant weakness and  fatigue as well as low back pain and weakness  in the lower extremities. He also has shortness breath with cough productive of whitish sputum but no hemoptysis. He lost around 25 pounds in the last few weeks. The patient denied having any headache or visual changes. Family history significant for mother with COPD, father died from heart attack and maternal grandfather had cancer. The patient is married and has 2 sons. He was accompanied by his wife Vickie. He used to work and Clinical cytogeneticist. He has a history of smoking 1 pack per day for around 50 years and he currently smoke around city cigarettes every day. He has a history of alcohol abuse in the past but not recently and no history of drug abuse.   HPI  Past Medical History  Diagnosis Date  . Other convulsions   . 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 Cholecystectomy      No family history on file.  Social History Social History  Substance Use Topics  . Smoking status: Current Every Day Smoker -- 0.25 packs/day for 20 years    Types: Cigarettes  . Smokeless tobacco: Never Used  . Alcohol Use: No    No Known Allergies  Current Outpatient Prescriptions  Medication Sig Dispense Refill  . acetaminophen (TYLENOL) 500 MG tablet Take 1,000 mg by mouth every 6 (six) hours as needed for mild pain or headache.    . diazepam (VALIUM) 5 MG tablet Take 1 tablet (5 mg total) by mouth every 6 (six) hours as needed for muscle spasms. 30 tablet 0  . gabapentin (NEURONTIN) 100 MG capsule Take 1 capsule (100 mg total) by mouth 3 (three) times daily. For 3 days, then increase to 2 tabs 3 times daily 30 capsule 0  . oxyCODONE (ROXICODONE) 5 MG immediate release tablet Take 1 tablet (5 mg total) by mouth every 4 (four) hours as needed for severe pain. 60 tablet 0  . levETIRAcetam (KEPPRA) 500 MG tablet Take 1 tablet (500 mg  total) by mouth 2 (two) times daily. (Patient not taking: Reported on 06/20/2015) 180 tablet 3   No current facility-administered medications for this visit.    Review of Systems  Constitutional: positive for anorexia, fatigue and weight loss Eyes: negative Ears, nose, mouth, throat, and face: negative Respiratory: positive for cough, dyspnea on exertion, sputum and wheezing Cardiovascular: negative Gastrointestinal: negative Genitourinary:negative Integument/breast: negative Hematologic/lymphatic: negative Musculoskeletal:positive for back pain, bone pain and muscle weakness Neurological: negative Behavioral/Psych: negative Endocrine: negative Allergic/Immunologic: negative  Physical Exam  XBM:WUXLK, healthy, no distress, anxious, ill looking and malnourished SKIN: skin color, texture, turgor are normal, no rashes or significant lesions HEAD: Normocephalic, No masses, lesions, tenderness or abnormalities EYES: normal, PERRLA, Conjunctiva are pink and non-injected EARS: External ears normal, Canals clear OROPHARYNX:no exudate, no erythema and lips, buccal mucosa, and tongue normal  NECK: supple, no adenopathy, no JVD LYMPH:  no palpable lymphadenopathy, no hepatosplenomegaly LUNGS: expiratory wheezes bilaterally, scattered rhonchi bilaterally HEART: regular rate & rhythm and no murmurs ABDOMEN:abdomen soft, non-tender, normal bowel sounds and no masses or organomegaly BACK: Back symmetric, no curvature., No CVA tenderness EXTREMITIES:no edema, no skin discoloration, no clubbing  NEURO: alert & oriented x 3 with fluent speech, no focal motor/sensory deficits  PERFORMANCE STATUS: ECOG 1-2  LABORATORY DATA: Lab Results  Component Value Date   WBC 8.4 06/20/2015   HGB 13.9 06/20/2015   HCT 40.6 06/20/2015  MCV 86.9 06/20/2015   PLT 131* 06/20/2015      Chemistry      Component Value Date/Time   NA 138 06/20/2015 0824   NA 138 06/12/2015 0810   K 4.5 06/20/2015  0824   K 3.8 06/12/2015 0810   CL 101 06/12/2015 0810   CO2 25 06/20/2015 0824   CO2 26 06/12/2015 0810   BUN 36.6* 06/20/2015 0824   BUN 16 06/12/2015 0810   CREATININE 1.6* 06/20/2015 0824   CREATININE 1.02 06/12/2015 0810      Component Value Date/Time   CALCIUM 11.6* 06/20/2015 0824   CALCIUM 9.9 06/12/2015 0810   ALKPHOS 175* 06/20/2015 0824   ALKPHOS 128* 06/11/2015 0600   AST 70* 06/20/2015 0824   AST 55* 06/11/2015 0600   ALT 20 06/20/2015 0824   ALT 25 06/11/2015 0600   BILITOT 0.57 06/20/2015 0824   BILITOT 1.0 06/11/2015 0600       RADIOGRAPHIC STUDIES: Dg Chest 2 View  06/09/2015  CLINICAL DATA:  Productive cough EXAM: CHEST  2 VIEW COMPARISON:  June 01, 2015 FINDINGS: There remains extensive airspace consolidation in the superior segment of the left lower lobe. This finding is stable compared to recent prior study. No new parenchymal lung opacity identified. There is extensive hilar and mediastinal adenopathy, stable from recent prior study. Heart size and pulmonary vascularity normal.  No bone lesions. IMPRESSION: Extensive airspace consolidation in the superior segment of the left lower lobe remains. Widespread adenopathy is also present. Given this adenopathy, contrast enhanced chest CT remains warranted, both to better delineate the adenopathy as well as to assess for possible endobronchial lesion in the superior segment left lower lobe region. Electronically Signed   By: Lowella Grip III M.D.   On: 06/09/2015 18:00   Dg Chest 2 View  06/01/2015  CLINICAL DATA:  Shortness of breath and chronic cough EXAM: CHEST  2 VIEW COMPARISON:  None. FINDINGS: There is airspace consolidation in the superior segment of the left lower lobe consistent with pneumonia. There is a probable nipple shadow at the left base. There is no edema or consolidation on the right. There is extensive adenopathy in the hilar regions as well as in the azygos, aortopulmonary window, and  inferior paratracheal regions. Heart size and pulmonary vascularity are normal. No bone lesions are apparent. IMPRESSION: Extensive consolidation in the superior segment left lower lobe. This finding is consistent with pneumonia. The possibility of an endobronchial lesion with postobstructive pneumonitis is a possibility, particularly given the adenopathy present. Extensive mediastinal and hilar adenopathy. Neoplasm is certainly suspected. Given this circumstance, contrast enhanced chest CT is advised. Electronically Signed   By: Lowella Grip III M.D.   On: 06/01/2015 11:15   Ct Chest W Contrast  06/09/2015  CLINICAL DATA:  69 year old male with shortness of breath, pulseless left leg and mass on aorta EXAM: CT ANGIOGRAPHY OF CHEST, ABDOMEN AND PELVIS WITH ILIOFEMORAL RUNOFF TECHNIQUE: Multidetector CT imaging of the abdomen, pelvis and lower extremities was performed using the standard protocol during bolus administration of intravenous contrast. Multiplanar CT image reconstructions and MIPs were obtained to evaluate the vascular anatomy. CONTRAST:  122m OMNIPAQUE IOHEXOL 350 MG/ML SOLN COMPARISON:  Prior chest x-ray 06/09/2015 FINDINGS: CTA CHEST Mediastinum: Bilateral left slightly greater than right supraclavicular adenopathy with the largest nodes measuring 15 mm in short axis on the left. There is extensive confluent adenopathy throughout the mediastinum and both hila. Measurement of individual index nodes is difficult given the confluence of the  adenopathy. However, the dominant right paratracheal node measures 28 x 34 cm in greatest diameter. The dominant subcarinal node measures approximately 34 x 44 mm. The prevascular nodal conglomerate measures approximately 27 by 48 mm at the common origin of the right brachiocephalic and left common carotid arteries. Heart/Vascular: 2 vessel aortic arch. The right brachiocephalic and left common carotid arteries share a common origin. Scattered  atherosclerotic plaque throughout the aorta. No aneurysm or dissection. Heterogeneous and irregular fibro fatty plaque and possible wall adherent mural thrombus. Calcifications are present throughout the coronary arteries. The heart is normal limits in size. No pericardial effusion. Lungs/Pleura: Confluent adenopathy from the mediastinum and left hilum extends in a peribronchovascular distribution along the right upper lobe and right lower lobe bronchi and pulmonary arteries. There is a 1.7 cm round area of low attenuation within the resultant macro lobulated left lower lobe mass consistent with an area of internal necrosis. Precise measurements of the lung mass are challenging giving its extent and a amorphous shape. The lesion measures approximately 8.0 by 6.3 by 9.1 cm. There is extensive ground-glass attenuation opacity throughout the remainder of the left lower lobe as well as nodular thickening along the major fissure consistent with lipid ache growth and interstitial spread of tumor. Multiple nodular metastases are present within the left lower lobe measuring up to 12 mm. Additional pulmonary nodules are noted in the left upper lobe measuring up to 4 mm concerning for distant metastases. Nonspecific subpleural nodularity present within the right upper lobe. There is a background of centrilobular pulmonary emphysema. Bones/Soft Tissues: No acute fracture or aggressive appearing lytic or blastic osseous lesion. CTA ABD/PELVIS WITH RUNOFF VASCULAR Aorta: Irregular heterogeneous atherosclerotic plaque throughout the abdominal aorta. No aneurysm. Celiac: Mild narrowing at the origin.  No visceral artery aneurysm. SMA: Mild narrowing at the origin. Accessory right hepatic artery from the SMA. No aneurysm or significant stenosis. Renals: Multiple (4) left-sided renal arteries. Two right renal arteries. No significant stenosis identified on either side. No evidence of dissection or renal artery aneurysm. No changes  of fibromuscular dysplasia. IMA: At least moderate narrowing at the origin. The vessel remains patent distally. RIGHT Lower Extremity Inflow: On the right, there is modest narrowing of the origin of the internal iliac artery. The common iliac artery is diseased but remains patent and without significant stenosis. Outflow: Fibro fatty plaque in the common femoral artery without significant stenosis. The profunda femoral artery is patent. The superficial femoral artery is diffusely diseased with multifocal areas of moderate and high-grade stenosis. There may be a focal short segment occlusion versus critical stenosis in the adductor canal. The popliteal artery is diseased but not significantly narrowed. Runoff: High-grade stenosis of the origin of the anterior tibial artery an within the distal tibioperoneal trunk. The anterior tibial artery becomes tiny at the ankle but there does appear to be three-vessel runoff to the ankle. LEFT lower Extremity Inflow: Heterogeneous and irregular atherosclerotic plaque in the bilateral common iliac arteries with at least moderate narrowing on the left. There is flush occlusion of the left external iliac artery beginning at the origin. High-grade stenosis at the origin of the left internal iliac artery but the distal branches remain patent. The distal left external iliac artery reconstitutes via retrograde flow from the common femoral artery. Outflow: Heterogeneous fibro fatty plaque with moderate narrowing throughout the common femoral artery. There is moderate narrowing at the origin of the profunda femoral artery. The superficial femoral artery appears chronically occluded just beyond its origin  but reconstitutes in the adductor canal secondary to geniculate collaterals. The popliteal artery is diseased and mildly narrowed but patent. Runoff: Limited evaluation of the runoff arteries secondary to decreased contrast bolus and calcified plaque. There appears to be significant  stenosis at the origin of the anterior tibial artery and throughout the tibioperoneal trunk. Patency of the runoff vessels below the tibioperoneal trunk is not well assessed by CTA. Veins: No focal venous abnormality. Review of the MIP images confirms the above findings. NON-VASCULAR Abdomen: Unremarkable CT appearance of the stomach, duodenum, spleen, adrenal glands and pancreas. Normal hepatic contour and morphology. There are several at least for a subtle low-attenuation lesions in the right hemi liver concerning for metastatic disease. The largest located posteriorly measures 1.8 cm but may represent a region of diaphragmatic infolding. The second largest lesion measures 17 mm in the anterior aspect of hepatic segment 5. The gallbladder is surgically absent. No intra or extrahepatic biliary ductal dilatation. Unremarkable appearance of the bilateral kidneys. No focal solid lesion, hydronephrosis or nephrolithiasis. No evidence of obstruction or focal bowel wall thickening. Normal appendix in the right lower quadrant. The terminal ileum is unremarkable. No free fluid or suspicious abdominal adenopathy. Pelvis: Mild prostatomegaly. Unremarkable appearance of the bladder and seminal vesicles. No free fluid or suspicious adenopathy. Bones/Soft Tissues: Soft tissue density within the marrow space of the proximal femurs bilaterally concerning for areas of possible osseous metastasis. No other definitive lytic or blastic osseous lesions. IMPRESSION: CTA CHEST 1. 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 The dominant mass appears to be within the left lower lobe and there is extensive confluent bi hilar, mediastinal and bilateral supraclavicular adenopathy as well as evidence of interstitial spread of tumor throughout the left lower lobe with extension into the pleural space of the major fissure. Additionally, there are likely  metastatic implants within the left upper lobe parenchyma. For tissue diagnosis, consider ultrasound-guided biopsy of the supraclavicular adenopathy. 2. Small low-attenuation lesions within liver are concerning for hepatic metastatic disease. 3. Round soft tissue density implants within the marrow space of the bilateral proximal femoral diaphyses concerning for osseous metastatic disease. 4. Heterogeneous and irregular atherosclerotic plaque throughout the thoracic aorta. No aneurysm or dissection. 5. Coronary artery calcifications. 6. Centrilobular pulmonary emphysema. CTA ABD/PELVIS WITH RUNOFF 1. 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 Depending on patient's symptomatology, this may be acute or chronic. 2. 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 Again, this may be acute or chronic. Chronic occlusion is favored given the background of significant peripheral arterial disease. 3. Significant left sided runoff disease. Patency of the runoff vessels to the ankle cannot be assessed due to poor contrast bolus from the multifocal left lower extremity occlusive disease. 4. On the right there is significant multifocal superficial femoral artery disease with multiple moderate and at least 1 high-grade stenosis. Additionally, there is significant proximal runoff disease on the right although the runoff vessels remain patent to the ankle. 5. At least 4 low-attenuation liver lesions concerning for metastatic disease. 6. Suspect osseous metastatic disease involving the medullary space of the bilateral proximal femoral diaphyses. Signed, Criselda Peaches, MD [facility] Electronically Signed   By: Jacqulynn Cadet M.D.   On: 06/09/2015 21:19   Mr Lumbar Spine Wo Contrast  06/10/2015  CLINICAL DATA:   Back pain. EXAM: MRI LUMBAR SPINE WITHOUT CONTRAST TECHNIQUE: Multiplanar, multisequence MR imaging of the lumbar spine was performed. No intravenous contrast was administered. COMPARISON:  None. FINDINGS: Each vertebra and visualized pelvis has infiltrating marrow signal abnormality consistent with metastatic disease. No superimposed spinal pathologic fracture. There is unusual edematous signal symmetrically in the upper gluteal musculature and right more than left iliacus without visualized pathologic fracture plane either by MR or preceding CT. No visualized canal or foraminal infiltration of tumor. Normal conus signal and morphology. Degenerative changes: T12- L1: Disc: No herniation. Facets: Negative. Canal: Patent. Foramina: No impingement. L1-L2: Disc: Disc bulging and spondylotic spurring. Facets: Negative. Canal: Ventral sac flattening without impingement Foramina: No impingement. L2-L3: Disc: Bulging with superimposed right paracentral to foraminal disc protrusion. Facets: Arthropathy with mild overgrowth. Canal: Ventral thecal sac flattening and partial right subarticular recess effacement. Foramina: No impingement. L3-L4: Disc: Bulging with probable bilateral paracentral protrusion based on sagittal sequences. Facets: Arthropathy with overgrowth Canal: Moderate triangular deformation without definite impingement. Foramina: No impingement. L4-L5: Disc: Circumferential disc bulging Facets: Arthropathy with bony overgrowth and ligamentum flavum thickening. Canal: High-grade stenosis with complete CSF effacement Foramina: Moderate left stenosis with near complete perineural fat effacement. Right stenosis is mild. L5-S1: Disc:  No significant finding. Facets: Negative. Canal: Patent. Foramina: No impingement. IMPRESSION: 1. Widespread osseous metastatic disease. 2. Myopathic changes around the posterior pelvis tha tis likely reactive to #1. No discrete pathologic fracture identified. 3. Disc and facet  degeneration with high-grade L4-5 canal stenosis. Electronically Signed   By: Monte Fantasia M.D.   On: 06/10/2015 21:25   Ct Angio Ao+bifem W/cm &/or Wo/cm  06/09/2015  CLINICAL DATA:  69 year old male with shortness of breath, pulseless left leg and mass on aorta EXAM: CT ANGIOGRAPHY OF CHEST, ABDOMEN AND PELVIS WITH ILIOFEMORAL RUNOFF TECHNIQUE: Multidetector CT imaging of the abdomen, pelvis and lower extremities was performed using the standard protocol during bolus administration of intravenous contrast. Multiplanar CT image reconstructions and MIPs were obtained to evaluate the vascular anatomy. CONTRAST:  154m OMNIPAQUE IOHEXOL 350 MG/ML SOLN COMPARISON:  Prior chest x-ray 06/09/2015 FINDINGS: CTA CHEST Mediastinum: Bilateral left slightly greater than right supraclavicular adenopathy with the largest nodes measuring 15 mm in short axis on the left. There is extensive confluent adenopathy throughout the mediastinum and both hila. Measurement of individual index nodes is difficult given the confluence of the adenopathy. However, the dominant right paratracheal node measures 28 x 34 cm in greatest diameter. The dominant subcarinal node measures approximately 34 x 44 mm. The prevascular nodal conglomerate measures approximately 27 by 48 mm at the common origin of the right brachiocephalic and left common carotid arteries. Heart/Vascular: 2 vessel aortic arch. The right brachiocephalic and left common carotid arteries share a common origin. Scattered atherosclerotic plaque throughout the aorta. No aneurysm or dissection. Heterogeneous and irregular fibro fatty plaque and possible wall adherent mural thrombus. Calcifications are present throughout the coronary arteries. The heart is normal limits in size. No pericardial effusion. Lungs/Pleura: Confluent adenopathy from the mediastinum and left hilum extends in a peribronchovascular distribution along the right upper lobe and right lower lobe bronchi and  pulmonary arteries. There is a 1.7 cm round area of low attenuation within the resultant macro lobulated left lower lobe mass consistent with an area of internal necrosis. Precise measurements of the lung mass are challenging giving its extent and a amorphous shape. The lesion measures approximately 8.0 by 6.3 by 9.1 cm. There is extensive ground-glass  attenuation opacity throughout the remainder of the left lower lobe as well as nodular thickening along the major fissure consistent with lipid ache growth and interstitial spread of tumor. Multiple nodular metastases are present within the left lower lobe measuring up to 12 mm. Additional pulmonary nodules are noted in the left upper lobe measuring up to 4 mm concerning for distant metastases. Nonspecific subpleural nodularity present within the right upper lobe. There is a background of centrilobular pulmonary emphysema. Bones/Soft Tissues: No acute fracture or aggressive appearing lytic or blastic osseous lesion. CTA ABD/PELVIS WITH RUNOFF VASCULAR Aorta: Irregular heterogeneous atherosclerotic plaque throughout the abdominal aorta. No aneurysm. Celiac: Mild narrowing at the origin.  No visceral artery aneurysm. SMA: Mild narrowing at the origin. Accessory right hepatic artery from the SMA. No aneurysm or significant stenosis. Renals: Multiple (4) left-sided renal arteries. Two right renal arteries. No significant stenosis identified on either side. No evidence of dissection or renal artery aneurysm. No changes of fibromuscular dysplasia. IMA: At least moderate narrowing at the origin. The vessel remains patent distally. RIGHT Lower Extremity Inflow: On the right, there is modest narrowing of the origin of the internal iliac artery. The common iliac artery is diseased but remains patent and without significant stenosis. Outflow: Fibro fatty plaque in the common femoral artery without significant stenosis. The profunda femoral artery is patent. The superficial  femoral artery is diffusely diseased with multifocal areas of moderate and high-grade stenosis. There may be a focal short segment occlusion versus critical stenosis in the adductor canal. The popliteal artery is diseased but not significantly narrowed. Runoff: High-grade stenosis of the origin of the anterior tibial artery an within the distal tibioperoneal trunk. The anterior tibial artery becomes tiny at the ankle but there does appear to be three-vessel runoff to the ankle. LEFT lower Extremity Inflow: Heterogeneous and irregular atherosclerotic plaque in the bilateral common iliac arteries with at least moderate narrowing on the left. There is flush occlusion of the left external iliac artery beginning at the origin. High-grade stenosis at the origin of the left internal iliac artery but the distal branches remain patent. The distal left external iliac artery reconstitutes via retrograde flow from the common femoral artery. Outflow: Heterogeneous fibro fatty plaque with moderate narrowing throughout the common femoral artery. There is moderate narrowing at the origin of the profunda femoral artery. The superficial femoral artery appears chronically occluded just beyond its origin but reconstitutes in the adductor canal secondary to geniculate collaterals. The popliteal artery is diseased and mildly narrowed but patent. Runoff: Limited evaluation of the runoff arteries secondary to decreased contrast bolus and calcified plaque. There appears to be significant stenosis at the origin of the anterior tibial artery and throughout the tibioperoneal trunk. Patency of the runoff vessels below the tibioperoneal trunk is not well assessed by CTA. Veins: No focal venous abnormality. Review of the MIP images confirms the above findings. NON-VASCULAR Abdomen: Unremarkable CT appearance of the stomach, duodenum, spleen, adrenal glands and pancreas. Normal hepatic contour and morphology. There are several at least for a  subtle low-attenuation lesions in the right hemi liver concerning for metastatic disease. The largest located posteriorly measures 1.8 cm but may represent a region of diaphragmatic infolding. The second largest lesion measures 17 mm in the anterior aspect of hepatic segment 5. The gallbladder is surgically absent. No intra or extrahepatic biliary ductal dilatation. Unremarkable appearance of the bilateral kidneys. No focal solid lesion, hydronephrosis or nephrolithiasis. No evidence of obstruction or focal bowel wall thickening. Normal  appendix in the right lower quadrant. The terminal ileum is unremarkable. No free fluid or suspicious abdominal adenopathy. Pelvis: Mild prostatomegaly. Unremarkable appearance of the bladder and seminal vesicles. No free fluid or suspicious adenopathy. Bones/Soft Tissues: Soft tissue density within the marrow space of the proximal femurs bilaterally concerning for areas of possible osseous metastasis. No other definitive lytic or blastic osseous lesions. IMPRESSION: CTA CHEST 1. 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 The dominant mass appears to be within the left lower lobe and there is extensive confluent bi hilar, mediastinal and bilateral supraclavicular adenopathy as well as evidence of interstitial spread of tumor throughout the left lower lobe with extension into the pleural space of the major fissure. Additionally, there are likely metastatic implants within the left upper lobe parenchyma. For tissue diagnosis, consider ultrasound-guided biopsy of the supraclavicular adenopathy. 2. Small low-attenuation lesions within liver are concerning for hepatic metastatic disease. 3. Round soft tissue density implants within the marrow space of the bilateral proximal femoral diaphyses concerning for osseous metastatic disease. 4. Heterogeneous and irregular atherosclerotic plaque throughout  the thoracic aorta. No aneurysm or dissection. 5. Coronary artery calcifications. 6. Centrilobular pulmonary emphysema. CTA ABD/PELVIS WITH RUNOFF 1. 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 Depending on patient's symptomatology, this may be acute or chronic. 2. 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 Again, this may be acute or chronic. Chronic occlusion is favored given the background of significant peripheral arterial disease. 3. Significant left sided runoff disease. Patency of the runoff vessels to the ankle cannot be assessed due to poor contrast bolus from the multifocal left lower extremity occlusive disease. 4. On the right there is significant multifocal superficial femoral artery disease with multiple moderate and at least 1 high-grade stenosis. Additionally, there is significant proximal runoff disease on the right although the runoff vessels remain patent to the ankle. 5. At least 4 low-attenuation liver lesions concerning for metastatic disease. 6. Suspect osseous metastatic disease involving the medullary space of the bilateral proximal femoral diaphyses. Signed, Criselda Peaches, MD [facility] Radiology Electronically Signed   By: Jacqulynn Cadet M.D.   On: 06/09/2015 21:19   US Biopsy  06/10/2015  CLINICAL DATA:  89-YEAR-OLD MALE WITH NEWLY DIAGNOSED WIDESPREAD METASTATIC DISEASE. 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 PATIENT HAS BILATERAL SUPRACLAVICULAR ADENOPATHY AND PRESENTS FOR ULTRASOUND-GUIDED CORE BIOPSY for tissue diagnosis. EXAM: ULTRASOUND GUIDED NEEDLE  BIOPSY LYMPH NODE COMPARISON:  None. PROCEDURE: Thyroid biopsy was thoroughly discussed with the patient and questions were answered. The benefits, risks, alternatives, and  complications were also discussed. The patient understands and wishes to proceed with the procedure. Written consent was obtained. Ultrasound was performed to localize and mark an adequate site for the biopsy. Sonographically, the right supraclavicular adenopathy is more impressive and approachable for biopsy than the left adenopathy. The patient was then prepped and draped in a normal sterile fashion. Local anesthesia was provided with 1% lidocaine. Using direct ultrasound guidance, multiple passes were made using the Bard 18 gauge automated biopsy device into the right supraclavicular adenopathy. Ultrasound was used to confirm needle placements on all occasions. Specimens were sent to Pathology for analysis. COMPLICATIONS: None Estimated blood loss:  0 FINDINGS: Bilateral supraclavicular adenopathy more impressive on the right than the left.  IMPRESSION: Ultrasound guided needle aspirate biopsy performed of the right supraclavicular adenopathy. Signed, Criselda Peaches, MD [facility] Radiology Electronically Signed   By: Jacqulynn Cadet M.D.   On: 06/10/2015 17:13   US Venous Img Lower Unilateral Left  06/01/2015  CLINICAL DATA:  69 year old with 1 week history of left lower leg pain. EXAM: LEFT LOWER EXTREMITY VENOUS DOPPLER ULTRASOUND TECHNIQUE: Gray-scale sonography with graded compression, as well as color Doppler and duplex ultrasound were performed to evaluate the lower extremity deep venous systems from the level of the common femoral vein and including the common femoral, femoral, profunda femoral, popliteal and calf veins including the posterior tibial, peroneal and gastrocnemius veins when visible. The superficial great saphenous vein was also interrogated. Spectral Doppler was utilized to evaluate flow at rest and with distal augmentation maneuvers in the common femoral, femoral and popliteal veins. COMPARISON:  None. FINDINGS: Contralateral  Common Femoral Vein: Respiratory phasicity is normal and symmetric with the symptomatic side. No evidence of thrombus. Normal compressibility. Common Femoral Vein: No evidence of thrombus. Normal compressibility, respiratory phasicity and response to augmentation. Saphenofemoral Junction: No evidence of thrombus. Normal compressibility and flow on color Doppler imaging. Profunda Femoral Vein: No evidence of thrombus. Normal compressibility and flow on color Doppler imaging. Femoral Vein: No evidence of thrombus. Normal compressibility, respiratory phasicity and response to augmentation. Popliteal Vein: No evidence of thrombus. Normal compressibility, respiratory phasicity and response to augmentation. Calf Veins: No evidence of thrombus. Normal compressibility and flow on color Doppler imaging. Superficial Great Saphenous Vein: No evidence of thrombus. Normal compressibility and flow on color Doppler imaging. Venous Reflux:  Not evaluated. Other Findings:  None. IMPRESSION: No evidence of left lower extremity DVT. Electronically Signed   By: Evangeline Dakin M.D.   On: 06/01/2015 11:50    ASSESSMENT: This is a very pleasant 69 years old white male recently diagnosed with extensive stage (T3, N3, M1b) small cell lung cancer presented with large left lower lobe lung mass in addition to bilateral hilar, supraclavicular and mediastinal lymphadenopathy in addition to liver and bone lesions diagnosed in December 2016. The patient has significant weakness and fatigue as well as weight loss.   PLAN: I had a lengthy discussion with the patient and his wife today about his current disease stage, prognosis and treatment options. The patient has poor general condition as well as malnutrition and very cachectic. I discussed with him and his wife the option of palliative care and hospice referral versus consideration of systemic chemotherapy. The patient is interested in treatment. I gave him the option of admission to  the hospital and start systemic chemotherapy inpatient because of his current condition but he would like to improve his treatment on outpatient basis. I will complete the staging workup by ordering whole body bone scan as well as MRI of the brain. This will be performed at [facility] closer to home. I will arrange for the patient to start systemic chemotherapy with reduced dose carboplatin for AUC of 4 on day 1 and etoposide 80 MG/M2 on days 1, 2 and 3 with Neulasta on day 4. I discussed with the patient and his wife the adverse effect of this treatment including but not limited to alopecia, myelosuppression, nausea and vomiting, peripheral neuropathy, liver or renal dysfunction. He is expected to start the first dose of this treatment on 06/25/2015. I will arrange for the patient to have a chemotherapy education class before the first dose of his treatment. For  the malnutrition and weight loss, I referred the patient to the dietitian at the [facility] for evaluation and recommendation regarding his nutritional status. For smoke cessation, I strongly encouraged the patient to quit smoking and offered him a smoking cessation program. I will call his pharmacy with prescription for Compazine 10 mg by mouth every 6 hours as needed for nausea. For pain management, I gave the patient refill of oxycodone 5 mg by mouth every 4 hours as needed for pain. I would also consider referring the patient to radiation oncology if he has significant bone disease on the upcoming bone scan. The patient would come back for follow-up visit in 2 weeks for reevaluation and management of any adverse effect of his treatment. He was advised to call immediately if he has any concerning symptoms in the interval.  The patient voices understanding of current disease status and treatment options and is in agreement with the current care plan.  All questions were answered. The patient knows to call the clinic with any  problems, questions or concerns. We can certainly see the patient much sooner if necessary.  Thank you so much for allowing me to participate in the care of Edwin Mckay. I will continue to follow up the patient with you and assist in his care.  I spent 55 minutes counseling the patient face to face. The total time spent in the appointment was 80 minutes.  Disclaimer: This note was dictated with voice recognition software. Similar sounding words can inadvertently be transcribed and may not be corrected upon review.   Tesla Keeler K. June 20, 2015, 10:01 AM

## 2015-06-20 NOTE — Telephone Encounter (Signed)
Gave patient/wife avs report and appointments for January - ched 1/3 and nutrition 1/5. Patient/wife also given appointments for bone scan/mri to be done at [facility] radiology 06/25/2015 at 8:30 am - spoke with Vivien Rota in [place] and gave copy of orders to [place] for [place]. Patient/wife aware he will be contacted re tx start day. Per 12/30 pof start tx 1/4 (carbo vp16) - not able accommodate starting tx 1/4 and also scan are scheduled for 1/4 due to no availability prior to 1/4. Message to MM and inf mgr to see if tx start date can be delayed until the week of 1/9.

## 2015-06-20 NOTE — Telephone Encounter (Signed)
Per response from MM patient really needs to stat tx 1/4. Approved by inf mgr and tx added by inf scheduler. Added additional cycles for week of 1/25 and also lab/fu for 1/10 as requested per 12/29 pof. Patient was given appointments for ched class - nutrition and scans prior to leaving yesterday and made aware I would call re remaining appointments. Left message for patient re starting tx 1/4 after scans at [place] pt will have tx at [facility]. Patient to get new schedule 06/24/15.

## 2015-06-21 ENCOUNTER — Encounter: Payer: Self-pay | Admitting: Internal Medicine

## 2015-06-24 ENCOUNTER — Encounter: Payer: Self-pay | Admitting: *Deleted

## 2015-06-24 ENCOUNTER — Other Ambulatory Visit: Payer: Medicare Other

## 2015-06-24 NOTE — Progress Notes (Signed)
Patient came to office thinking he had app't to see MD.  Refused to stay for chemo class.  Patient and family state he is not going to do chemo.  Gave family copy of current schedule.  Family states they will call off to speak with Dr. Worthy Flank nurse.

## 2015-06-25 ENCOUNTER — Ambulatory Visit: Payer: Medicare Other | Admitting: Internal Medicine

## 2015-06-25 ENCOUNTER — Encounter: Admission: RE | Admit: 2015-06-25 | Payer: Medicare Other | Source: Ambulatory Visit

## 2015-06-25 ENCOUNTER — Ambulatory Visit: Payer: Medicare Other

## 2015-06-25 ENCOUNTER — Other Ambulatory Visit: Payer: Self-pay | Admitting: Medical Oncology

## 2015-06-25 ENCOUNTER — Other Ambulatory Visit: Payer: Medicare Other

## 2015-06-25 DIAGNOSIS — C3492 Malignant neoplasm of unspecified part of left bronchus or lung: Secondary | ICD-10-CM

## 2015-06-26 ENCOUNTER — Ambulatory Visit: Payer: Medicare Other

## 2015-06-26 ENCOUNTER — Encounter: Payer: Medicare Other | Admitting: Nutrition

## 2015-06-26 ENCOUNTER — Telehealth: Payer: Self-pay | Admitting: *Deleted

## 2015-06-26 NOTE — Telephone Encounter (Signed)
Oncology Nurse Navigator Documentation  Oncology Nurse Navigator Flowsheets 06/26/2015  Navigator Encounter Type Telephone/I noticed patient did not have scans and was a no show for chemotherapy.  I called and spoke with the wife today.  I listened as she explained how he was doing. I asked if I could speak with him.  She said he was asleep.  She stated he doesn't want chemo.  I again listened.  I explained to her about chemo and the type of lung cancer is responsive to chemotherapy.  I asked that the patient call to cancel any further appt's here at [facility].  I asked that she call her PCP for Hospice support.  She stated she would.   Patient Visit Type Follow-up  Time Spent with Patient 30

## 2015-06-27 ENCOUNTER — Ambulatory Visit: Payer: Medicare Other

## 2015-06-28 ENCOUNTER — Ambulatory Visit: Payer: Medicare Other

## 2015-06-30 ENCOUNTER — Other Ambulatory Visit: Payer: Self-pay

## 2015-06-30 ENCOUNTER — Other Ambulatory Visit: Payer: Self-pay | Admitting: *Deleted

## 2015-06-30 ENCOUNTER — Inpatient Hospital Stay (HOSPITAL_COMMUNITY): Payer: Medicare Other

## 2015-06-30 ENCOUNTER — Emergency Department (HOSPITAL_COMMUNITY): Payer: Medicare Other

## 2015-06-30 ENCOUNTER — Encounter (HOSPITAL_COMMUNITY): Payer: Self-pay | Admitting: Emergency Medicine

## 2015-06-30 ENCOUNTER — Inpatient Hospital Stay (HOSPITAL_COMMUNITY)
Admission: EM | Admit: 2015-06-30 | Discharge: 2015-07-23 | DRG: 871 | Disposition: E | Payer: Medicare Other | Attending: Internal Medicine | Admitting: Internal Medicine

## 2015-06-30 DIAGNOSIS — E876 Hypokalemia: Secondary | ICD-10-CM | POA: Diagnosis present

## 2015-06-30 DIAGNOSIS — R7989 Other specified abnormal findings of blood chemistry: Secondary | ICD-10-CM | POA: Diagnosis not present

## 2015-06-30 DIAGNOSIS — I214 Non-ST elevation (NSTEMI) myocardial infarction: Secondary | ICD-10-CM | POA: Diagnosis present

## 2015-06-30 DIAGNOSIS — D61818 Other pancytopenia: Secondary | ICD-10-CM | POA: Diagnosis present

## 2015-06-30 DIAGNOSIS — R627 Adult failure to thrive: Secondary | ICD-10-CM | POA: Diagnosis not present

## 2015-06-30 DIAGNOSIS — F17210 Nicotine dependence, cigarettes, uncomplicated: Secondary | ICD-10-CM | POA: Diagnosis present

## 2015-06-30 DIAGNOSIS — E860 Dehydration: Secondary | ICD-10-CM | POA: Diagnosis not present

## 2015-06-30 DIAGNOSIS — R0600 Dyspnea, unspecified: Secondary | ICD-10-CM | POA: Diagnosis not present

## 2015-06-30 DIAGNOSIS — Z515 Encounter for palliative care: Secondary | ICD-10-CM | POA: Diagnosis present

## 2015-06-30 DIAGNOSIS — N179 Acute kidney failure, unspecified: Secondary | ICD-10-CM | POA: Insufficient documentation

## 2015-06-30 DIAGNOSIS — E43 Unspecified severe protein-calorie malnutrition: Secondary | ICD-10-CM | POA: Diagnosis present

## 2015-06-30 DIAGNOSIS — C799 Secondary malignant neoplasm of unspecified site: Secondary | ICD-10-CM | POA: Insufficient documentation

## 2015-06-30 DIAGNOSIS — C801 Malignant (primary) neoplasm, unspecified: Secondary | ICD-10-CM | POA: Diagnosis not present

## 2015-06-30 DIAGNOSIS — Z681 Body mass index (BMI) 19 or less, adult: Secondary | ICD-10-CM | POA: Diagnosis not present

## 2015-06-30 DIAGNOSIS — Z66 Do not resuscitate: Secondary | ICD-10-CM

## 2015-06-30 DIAGNOSIS — C7951 Secondary malignant neoplasm of bone: Secondary | ICD-10-CM | POA: Diagnosis present

## 2015-06-30 DIAGNOSIS — C3490 Malignant neoplasm of unspecified part of unspecified bronchus or lung: Secondary | ICD-10-CM

## 2015-06-30 DIAGNOSIS — J9600 Acute respiratory failure, unspecified whether with hypoxia or hypercapnia: Secondary | ICD-10-CM | POA: Diagnosis present

## 2015-06-30 DIAGNOSIS — R778 Other specified abnormalities of plasma proteins: Secondary | ICD-10-CM | POA: Diagnosis present

## 2015-06-30 DIAGNOSIS — E870 Hyperosmolality and hypernatremia: Secondary | ICD-10-CM | POA: Diagnosis present

## 2015-06-30 DIAGNOSIS — I739 Peripheral vascular disease, unspecified: Secondary | ICD-10-CM | POA: Diagnosis present

## 2015-06-30 DIAGNOSIS — C779 Secondary and unspecified malignant neoplasm of lymph node, unspecified: Secondary | ICD-10-CM | POA: Diagnosis present

## 2015-06-30 DIAGNOSIS — R6521 Severe sepsis with septic shock: Secondary | ICD-10-CM

## 2015-06-30 DIAGNOSIS — C3492 Malignant neoplasm of unspecified part of left bronchus or lung: Secondary | ICD-10-CM | POA: Diagnosis present

## 2015-06-30 DIAGNOSIS — J189 Pneumonia, unspecified organism: Secondary | ICD-10-CM | POA: Diagnosis present

## 2015-06-30 DIAGNOSIS — G40209 Localization-related (focal) (partial) symptomatic epilepsy and epileptic syndromes with complex partial seizures, not intractable, without status epilepticus: Secondary | ICD-10-CM | POA: Diagnosis present

## 2015-06-30 DIAGNOSIS — Z781 Physical restraint status: Secondary | ICD-10-CM

## 2015-06-30 DIAGNOSIS — I248 Other forms of acute ischemic heart disease: Secondary | ICD-10-CM | POA: Diagnosis present

## 2015-06-30 DIAGNOSIS — A419 Sepsis, unspecified organism: Principal | ICD-10-CM | POA: Diagnosis present

## 2015-06-30 DIAGNOSIS — G9340 Encephalopathy, unspecified: Secondary | ICD-10-CM | POA: Diagnosis present

## 2015-06-30 DIAGNOSIS — D696 Thrombocytopenia, unspecified: Secondary | ICD-10-CM | POA: Diagnosis present

## 2015-06-30 LAB — CBC WITH DIFFERENTIAL/PLATELET
BASOS PCT: 0 %
Basophils Absolute: 0 10*3/uL (ref 0.0–0.1)
EOS PCT: 0 %
Eosinophils Absolute: 0 10*3/uL (ref 0.0–0.7)
HEMATOCRIT: 42 % (ref 39.0–52.0)
HEMOGLOBIN: 13.9 g/dL (ref 13.0–17.0)
LYMPHS ABS: 1.7 10*3/uL (ref 0.7–4.0)
Lymphocytes Relative: 26 %
MCH: 29.5 pg (ref 26.0–34.0)
MCHC: 33.1 g/dL (ref 30.0–36.0)
MCV: 89.2 fL (ref 78.0–100.0)
Monocytes Absolute: 0.3 10*3/uL (ref 0.1–1.0)
Monocytes Relative: 4 %
NEUTROS ABS: 4.6 10*3/uL (ref 1.7–7.7)
NRBC: 6 /100{WBCs} — AB
Neutrophils Relative %: 70 %
Platelets: 76 10*3/uL — ABNORMAL LOW (ref 150–400)
RBC: 4.71 MIL/uL (ref 4.22–5.81)
RDW: 13.2 % (ref 11.5–15.5)
WBC: 6.6 10*3/uL (ref 4.0–10.5)

## 2015-06-30 LAB — COMPREHENSIVE METABOLIC PANEL
ALT: 23 U/L (ref 17–63)
ANION GAP: 18 — AB (ref 5–15)
AST: 66 U/L — ABNORMAL HIGH (ref 15–41)
Albumin: 2.5 g/dL — ABNORMAL LOW (ref 3.5–5.0)
Alkaline Phosphatase: 164 U/L — ABNORMAL HIGH (ref 38–126)
BUN: 61 mg/dL — AB (ref 6–20)
CHLORIDE: 103 mmol/L (ref 101–111)
CO2: 28 mmol/L (ref 22–32)
CREATININE: 1.91 mg/dL — AB (ref 0.61–1.24)
Calcium: 13 mg/dL — ABNORMAL HIGH (ref 8.9–10.3)
GFR, EST AFRICAN AMERICAN: 39 mL/min — AB (ref >60)
GFR, EST NON AFRICAN AMERICAN: 34 mL/min — AB (ref >60)
Glucose, Bld: 119 mg/dL — ABNORMAL HIGH (ref 65–99)
Potassium: 4.2 mmol/L (ref 3.5–5.1)
Sodium: 149 mmol/L — ABNORMAL HIGH (ref 135–145)
Total Bilirubin: 1.2 mg/dL (ref 0.3–1.2)
Total Protein: 7.5 g/dL (ref 6.5–8.1)

## 2015-06-30 LAB — LACTIC ACID, PLASMA
Lactic Acid, Venous: 2.8 mmol/L (ref 0.5–2.0)
Lactic Acid, Venous: 2.4 mmol/L (ref 0.5–2.0)

## 2015-06-30 LAB — APTT: APTT: 30 s (ref 24–37)

## 2015-06-30 LAB — I-STAT CG4 LACTIC ACID, ED: Lactic Acid, Venous: 5.37 mmol/L (ref 0.5–2.0)

## 2015-06-30 LAB — PHOSPHORUS: Phosphorus: 3.1 mg/dL (ref 2.5–4.6)

## 2015-06-30 LAB — PROTIME-INR
INR: 1.47 (ref 0.00–1.49)
PROTHROMBIN TIME: 17.9 s — AB (ref 11.6–15.2)

## 2015-06-30 LAB — PROCALCITONIN: Procalcitonin: 2.19 ng/mL

## 2015-06-30 LAB — MAGNESIUM: MAGNESIUM: 2.3 mg/dL (ref 1.7–2.4)

## 2015-06-30 LAB — TROPONIN I
Troponin I: 0.74 ng/mL (ref <0.031)
Troponin I: 0.62 ng/mL (ref <0.031)

## 2015-06-30 LAB — MRSA PCR SCREENING: MRSA by PCR: NEGATIVE

## 2015-06-30 LAB — TSH: TSH: 0.771 u[IU]/mL (ref 0.350–4.500)

## 2015-06-30 MED ORDER — VANCOMYCIN HCL 500 MG IV SOLR
500.0000 mg | INTRAVENOUS | Status: DC
  Administered 2015-07-01: 500 mg via INTRAVENOUS
  Filled 2015-06-30 (×2): qty 500

## 2015-06-30 MED ORDER — SODIUM CHLORIDE 0.9 % IV SOLN
INTRAVENOUS | Status: DC
  Administered 2015-06-30: 17:00:00 via INTRAVENOUS

## 2015-06-30 MED ORDER — IPRATROPIUM-ALBUTEROL 0.5-2.5 (3) MG/3ML IN SOLN: 3.0000 mL | RESPIRATORY_TRACT | Status: DC | PRN

## 2015-06-30 MED ORDER — VANCOMYCIN HCL IN DEXTROSE 1-5 GM/200ML-% IV SOLN: 1000.0000 mg | Freq: Once | INTRAVENOUS | Status: DC

## 2015-06-30 MED ORDER — SODIUM CHLORIDE 0.9 % IV BOLUS (SEPSIS)
1000.0000 mL | INTRAVENOUS | Status: AC
  Administered 2015-06-30 (×2): 1000 mL via INTRAVENOUS

## 2015-06-30 MED ORDER — HYDROMORPHONE HCL 1 MG/ML IJ SOLN
1.0000 mg | INTRAMUSCULAR | Status: DC | PRN
  Administered 2015-06-30 – 2015-07-03 (×2): 1 mg via INTRAVENOUS
  Filled 2015-06-30 (×2): qty 1

## 2015-06-30 MED ORDER — OXYCODONE HCL 5 MG PO TABS: 5.0000 mg | ORAL_TABLET | ORAL | Status: DC | PRN

## 2015-06-30 MED ORDER — VANCOMYCIN HCL IN DEXTROSE 1-5 GM/200ML-% IV SOLN
1000.0000 mg | Freq: Once | INTRAVENOUS | Status: AC
  Administered 2015-06-30: 1000 mg via INTRAVENOUS
  Filled 2015-06-30: qty 200

## 2015-06-30 MED ORDER — ONDANSETRON HCL 4 MG PO TABS: 4.0000 mg | ORAL_TABLET | Freq: Four times a day (QID) | ORAL | Status: DC | PRN

## 2015-06-30 MED ORDER — DEXTROSE 5 % IV SOLN
1.0000 g | INTRAVENOUS | Status: DC
  Administered 2015-07-01: 1 g via INTRAVENOUS
  Filled 2015-06-30 (×2): qty 1

## 2015-06-30 MED ORDER — SODIUM CHLORIDE 0.9 % IV SOLN
500.0000 mg | Freq: Two times a day (BID) | INTRAVENOUS | Status: DC
  Administered 2015-06-30 – 2015-07-04 (×8): 500 mg via INTRAVENOUS
  Filled 2015-06-30 (×12): qty 5

## 2015-06-30 MED ORDER — ENOXAPARIN SODIUM 30 MG/0.3ML ~~LOC~~ SOLN
30.0000 mg | SUBCUTANEOUS | Status: DC
Start: 2015-06-30 — End: 2015-07-02
  Administered 2015-07-01 (×2): 30 mg via SUBCUTANEOUS
  Filled 2015-06-30: qty 0.3

## 2015-06-30 MED ORDER — DEXTROSE 5 % IV SOLN: 2.0000 g | Freq: Once | INTRAVENOUS | Status: DC

## 2015-06-30 MED ORDER — SODIUM CHLORIDE 0.9 % IV BOLUS (SEPSIS)
500.0000 mL | Freq: Once | INTRAVENOUS | Status: AC
  Administered 2015-06-30: 500 mL via INTRAVENOUS

## 2015-06-30 MED ORDER — LORAZEPAM 2 MG/ML IJ SOLN
1.0000 mg | INTRAMUSCULAR | Status: DC | PRN
  Administered 2015-07-01 – 2015-07-03 (×2): 1 mg via INTRAVENOUS
  Filled 2015-06-30 (×2): qty 1

## 2015-06-30 MED ORDER — DEXTROSE 5 % IV SOLN
2.0000 g | Freq: Once | INTRAVENOUS | Status: AC
  Administered 2015-06-30: 2 g via INTRAVENOUS
  Filled 2015-06-30: qty 2

## 2015-06-30 MED ORDER — ONDANSETRON HCL 4 MG/2ML IJ SOLN: 4.0000 mg | Freq: Four times a day (QID) | INTRAMUSCULAR | Status: DC | PRN

## 2015-06-30 MED ORDER — LEVETIRACETAM 500 MG PO TABS: 500.0000 mg | ORAL_TABLET | Freq: Two times a day (BID) | ORAL | Status: DC

## 2015-06-30 NOTE — Progress Notes (Signed)
Pt was scheduled for home health services since  [facility] Arranged: RN, PT, Social Work, Nurse's Aide [place] Agency: [facility]

## 2015-06-30 NOTE — ED Provider Notes (Signed)
CSN: [id number]     Arrival date & time 06/22/2015  1136 History   First MD Initiated Contact with Patient 07/15/2015 1202     Chief Complaint  Patient presents with  . Cancer  . Failure To Thrive  . Hypotension     (Consider location/radiation/quality/duration/timing/severity/associated sxs/prior Treatment) The history is provided by the patient, a relative and the spouse. The history is limited by the condition of the patient.  Patient  w hx widely metastatic small cell lung ca, to liver, l/n, bones, c/o generalized weakness, poor appetite, and weight loss. Family indicates pts lefts too weak to hold him. Unspecified amt weight loss. Pt denies injury/pain. No fever or chills. Pt very difficult/limited historian, level 5 caveat.        Past Medical History  Diagnosis Date  . Other convulsions   . Small cell carcinoma of left lung ([place]) 06/20/2015  . Small cell carcinoma of left lung ([place]) 06/20/2015   Past Surgical History  Procedure Laterality Date  . Cholecystectomy     No family history on file. Social History  Substance Use Topics  . Smoking status: Current Every Day Smoker -- 0.25 packs/day for 20 years    Types: Cigarettes  . Smokeless tobacco: Never Used  . Alcohol Use: No       Review of Systems  Unable to perform ROS: Mental status change  Constitutional: Negative for fever.  pt with confusion, doesn't respond to most ros questions asked - level 5 caveat     Allergies  Review of patient's allergies indicates no known allergies.  Home Medications   Prior to Admission medications   Medication Sig Start Date End Date Taking? Authorizing Provider  acetaminophen (TYLENOL) 500 MG tablet Take 1,000 mg by mouth every 6 (six) hours as needed for mild pain or headache.    Historical Provider, MD  diazepam (VALIUM) 5 MG tablet Take 1 tablet (5 mg total) by mouth every 6 (six) hours as needed for muscle spasms. 06/01/15   Pierce Crane Beers, PA-C  gabapentin (NEURONTIN)  100 MG capsule Take 1 capsule (100 mg total) by mouth 3 (three) times daily. For 3 days, then increase to 2 tabs 3 times daily 06/01/15 05/31/16  Pierce Crane Beers, PA-C  levETIRAcetam (KEPPRA) 500 MG tablet Take 1 tablet (500 mg total) by mouth 2 (two) times daily. Patient not taking: Reported on 06/20/2015 09/06/14   Marcial Pacas, MD  oxyCODONE (ROXICODONE) 5 MG immediate release tablet Take 1 tablet (5 mg total) by mouth every 4 (four) hours as needed for severe pain. 06/20/15   Curt Bears, MD   BP 88/72 mmHg  Pulse 129  Temp(Src) 97.4 F (36.3 C)  Resp 18  SpO2 95% Physical Exam  Constitutional: He is oriented to person, place, and time. He appears distressed.  Very thin, cachectic appearing, low bp.   HENT:  Head: Atraumatic.  Mouth/Throat: Oropharynx is clear and moist.  Eyes: Conjunctivae are normal. Pupils are equal, round, and reactive to light. No scleral icterus.  Neck: Neck supple. No tracheal deviation present.  No stiffness or rigidity  Cardiovascular: Regular rhythm, normal heart sounds and intact distal pulses.  Exam reveals no gallop and no friction rub.   No murmur heard. Tachycardic.   Pulmonary/Chest: Effort normal and breath sounds normal. No accessory muscle usage. No respiratory distress.  Abdominal: Soft. Bowel sounds are normal. He exhibits no distension. There is no tenderness.  Genitourinary:  No cva tenderness  Musculoskeletal: Normal range of motion.  He exhibits no edema or tenderness.  Neurological: He is alert and oriented to person, place, and time.  Motor intact bil. sens grossly intact.   Skin: Skin is warm and dry. No rash noted.  Psychiatric:  Content appearing.   Nursing note and vitals reviewed.   ED Course  Procedures (including critical care time) Labs Review   Results for orders placed or performed during the hospital encounter of 07/16/2015  Comprehensive metabolic panel  Result Value Ref Range   Sodium 149 (H) 135 - 145 mmol/L    Potassium 4.2 3.5 - 5.1 mmol/L   Chloride 103 101 - 111 mmol/L   CO2 28 22 - 32 mmol/L   Glucose, Bld 119 (H) 65 - 99 mg/dL   BUN 61 (H) 6 - 20 mg/dL   Creatinine, Ser 1.91 (H) 0.61 - 1.24 mg/dL   Calcium 13.0 (H) 8.9 - 10.3 mg/dL   Total Protein 7.5 6.5 - 8.1 g/dL   Albumin 2.5 (L) 3.5 - 5.0 g/dL   AST 66 (H) 15 - 41 U/L   ALT 23 17 - 63 U/L   Alkaline Phosphatase 164 (H) 38 - 126 U/L   Total Bilirubin 1.2 0.3 - 1.2 mg/dL   GFR calc non Af Amer 34 (L) >60 mL/min   GFR calc Af Amer 39 (L) >60 mL/min   Anion gap 18 (H) 5 - 15  CBC WITH DIFFERENTIAL  Result Value Ref Range   WBC 6.6 4.0 - 10.5 K/uL   RBC 4.71 4.22 - 5.81 MIL/uL   Hemoglobin 13.9 13.0 - 17.0 g/dL   HCT 42.0 39.0 - 52.0 %   MCV 89.2 78.0 - 100.0 fL   MCH 29.5 26.0 - 34.0 pg   MCHC 33.1 30.0 - 36.0 g/dL   RDW 13.2 11.5 - 15.5 %   Platelets 76 (L) 150 - 400 K/uL   Neutrophils Relative % 70 %   Lymphocytes Relative 26 %   Monocytes Relative 4 %   Eosinophils Relative 0 %   Basophils Relative 0 %   nRBC 6 (H) 0 /100 WBC   Neutro Abs 4.6 1.7 - 7.7 K/uL   Lymphs Abs 1.7 0.7 - 4.0 K/uL   Monocytes Absolute 0.3 0.1 - 1.0 K/uL   Eosinophils Absolute 0.0 0.0 - 0.7 K/uL   Basophils Absolute 0.0 0.0 - 0.1 K/uL   RBC Morphology RARE NRBCs    Smear Review LARGE PLATELETS PRESENT   Troponin I  Result Value Ref Range   Troponin I 0.74 (HH) <0.031 ng/mL  I-Stat CG4 Lactic Acid, ED  (not at  [facility])  Result Value Ref Range   Lactic Acid, Venous 5.37 (HH) 0.5 - 2.0 mmol/L   Comment NOTIFIED PHYSICIAN     Dg Chest Port 1 View  07/08/2015  CLINICAL DATA:  History of lung malignancy with metastases to the liver and elsewhere ; decreased oral ingestion over the past several days EXAM: PORTABLE CHEST 1 VIEW COMPARISON:  Chest x-ray of June 08 2014 FINDINGS: The lungs are adequately inflated. Progressive interstitial and alveolar opacity in the left lower lobe has occurred. Increased left suprahilar interstitial density. There  is stable bilateral paratracheal soft tissue density. The right lung is grossly clear. There is no pleural effusion or pneumothorax. The heart is normal in size. IMPRESSION: Worsening of airspace opacity in the superior segment of the left lower lobe compatible with pneumonia, possibly postobstructive. Increasing left suprahilar density. Persistent bulky paratracheal lymphadenopathy. Electronically Signed   By: Shanon Brow  [place] M.D.   On: 07/15/2015 12:45       I have personally reviewed and evaluated these images and lab results as part of my medical decision-making.   EKG Interpretation   Date/Time:  Monday June 30 2015 11:57:54 EST Ventricular Rate:  121 PR Interval:  131 QRS Duration: 127 QT Interval:  322 QTC Calculation: 457 R Axis:   75 Text Interpretation:  Sinus tachycardia Left bundle branch block  Non-specific ST-t changes Confirmed by Ashok Cordia  MD, Lennette Bihari ([id number]) on  07/13/2015 12:20:12 PM      MDM   Iv ns bolus. Continuous pulse ox and monitor.  Reviewed nursing notes and prior charts for additional history.   Iv ns bolus - given hypotension, high lactate, 30 ml/kg iv.   Cultures sent.  Iv abx ordered re possible hcap.   bp improved post ivf.  Med service contacted for admission.  Family/spouse indicates willing to have palliative care/hospice eval during inpatient stay.  CRITICAL CARE  RE hypotension, septic shock, metastatic cancer.  Performed by: Mirna Mires Total critical care time: 40 minutes Critical care time was exclusive of separately billable procedures and treating other patients. Critical care was necessary to treat or prevent imminent or life-threatening deterioration. Critical care was time spent personally by me on the following activities: development of treatment plan with patient and/or surrogate as well as nursing, discussions with consultants, evaluation of patient's response to treatment, examination of patient, obtaining history from  patient or surrogate, ordering and performing treatments and interventions, ordering and review of laboratory studies, ordering and review of radiographic studies, pulse oximetry and re-evaluation of patient's condition.    Lajean Saver, MD 07/17/2015 [phone]

## 2015-06-30 NOTE — Progress Notes (Signed)
ANTIBIOTIC CONSULT NOTE - INITIAL  Pharmacy Consult for Cefepime/Vancomycin Indication: pneumonia  No Known Allergies  Patient Measurements: Height: '5\' 4"'$  (162.6 cm) Weight: 118 lb (53.524 kg) IBW/kg (Calculated) : 59.2 Adjusted Body Weight:   Vital Signs: Temp: 97.4 F (36.3 C) (01/09 1149) BP: 109/65 mmHg (01/09 1307) Pulse Rate: 104 (01/09 1307) Intake/Output from previous day:   Intake/Output from this shift:    Labs:  Recent Labs  07/13/2015 1206  WBC 6.6  HGB 13.9  PLT 76*  CREATININE 1.91*   Estimated Creatinine Clearance: 27.2 mL/min (by C-G formula based on Cr of 1.91). No results for input(s): VANCOTROUGH, VANCOPEAK, VANCORANDOM, GENTTROUGH, GENTPEAK, GENTRANDOM, TOBRATROUGH, TOBRAPEAK, TOBRARND, AMIKACINPEAK, AMIKACINTROU, AMIKACIN in the last 72 hours.   Microbiology: Recent Results (from the past 720 hour(s))  Blood culture (routine x 2)     Status: None   Collection Time: 06/09/15  9:33 PM  Result Value Ref Range Status   Specimen Description BLOOD RIGHT ANTECUBITAL  Final   Special Requests BOTTLES DRAWN AEROBIC AND ANAEROBIC 5CC  Final   Culture  Setup Time   Final    GRAM POSITIVE COCCI IN CLUSTERS AEROBIC BOTTLE ONLY CRITICAL RESULT CALLED TO, READ BACK BY AND VERIFIED WITH: A JOHNSON RN [id number] 06/10/15 A BROWNING    Culture   Final    STAPHYLOCOCCUS SPECIES (COAGULASE NEGATIVE) THE SIGNIFICANCE OF ISOLATING THIS ORGANISM FROM A SINGLE SET OF BLOOD CULTURES WHEN MULTIPLE SETS ARE DRAWN IS UNCERTAIN. PLEASE NOTIFY THE MICROBIOLOGY DEPARTMENT WITHIN ONE WEEK IF SPECIATION AND SENSITIVITIES ARE REQUIRED.    Report Status 06/12/2015 FINAL  Final  Blood culture (routine x 2)     Status: None   Collection Time: 06/09/15  9:35 PM  Result Value Ref Range Status   Specimen Description BLOOD LEFT ANTECUBITAL  Final   Special Requests BOTTLES DRAWN AEROBIC AND ANAEROBIC 4CC  Final   Culture NO GROWTH 5 DAYS  Final   Report Status 06/14/2015 FINAL  Final   MRSA PCR Screening     Status: None   Collection Time: 06/10/15  6:21 PM  Result Value Ref Range Status   MRSA by PCR NEGATIVE NEGATIVE Final    Comment:        The GeneXpert MRSA Assay (FDA approved for NASAL specimens only), is one component of a comprehensive MRSA colonization surveillance program. It is not intended to diagnose MRSA infection nor to guide or monitor treatment for MRSA infections.     Medical History: Past Medical History  Diagnosis Date  . Other convulsions   . 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  Vancomycin 1g and Cefepime 2g given in ED.    - Lactic acid 5.37 - WBC WNL - CrCl~26 ml/min CG - afebrile  - CXR = Worsening of airspace opacity in the superior segment of the left lower lobe compatible with pneumonia, possibly postobstructive  Goal of Therapy:  Vancomycin trough level 15-20 mcg/ml  Doses adjusted per renal function Eradication of infection  Plan:  1.  Vancomycin 500 mg IV q24h. 2.  Cefepime 1g IV q24h. 3.  F/u SCr, trough levels as indicated, clinical course.  Hershal Coria 06/28/2015,1:47 PM

## 2015-06-30 NOTE — ED Notes (Addendum)
Per family, Pt has cancer in his lungs, liver, and multiple other spots. Pt was told he had two months to live and that was three weeks ago. Family is concerned because he has not been eating anything over the past couple of days. Family sts pt's health is drastically declining. A&Ox4. Pt appears weak. Pt is a full code per wife. Husband has deferred all questions to his wife.

## 2015-06-30 NOTE — Progress Notes (Signed)
Spoke with Bethena Roys of [facility] about pt admission Bethena Roys noting pt not wanting care per [facility] records and wife had a request for full code  [facility] aware of admission

## 2015-06-30 NOTE — H&P (Signed)
[facility] History and Physical  Edwin Mckay YWV:[id number] DOB: Sep 03, 1945 DOA: 06/27/2015  Referring physician: ED physician, Dr. Ashok Cordia  PCP: Morton Peters, MD   Chief Complaint: failure to thrive   HPI:  70 y.o. 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 Julien Nordmann, presented today by family members for evaluation of progressive failure to thrive, weight loss over 25 lbs in the past month. Please note that pt is unable to provide any history and there is no family at bedside, most of the details obtained from ED doctor. No reported fevers, abd or urinary concerns.   In ED, pt noted to be confused, requiring restraints. VS notable for HR up to 129, RR up to 27, SBP in 80-90's. CXR with worsening airspace opacities in the left lobe c/w PNA. Blood work notable for Na 149, Cr 1.91, Ca 13, troponin 0.74 --> 0.62, lactic acid 5.37 --> 2.8 after fluids. TRH asked to admit for further evaluation.   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 Julien Nordmann, will notify him of pt's admission - d/w wife, extent of illness and critical nature of acute illness - pt made DNR, continue current efforts and reassess response in 24 hours - comfort is desired pre family if pt does not respond to current measures - PCT also consulted     Acute respiratory failure secondary to PNA, LLL vs obstructive in pt with lung cancer - ABX as noted  above - allow BD as needed - keep on oxygen via [place] to keep oxygen sat > 92%     Acute encephalopathy - secondary to acute illness - comfort desired per family     Hypercalcemia - from dehydration and malignancy - continue with IVF and repeat BMP In AM    Hypernatremia - also from dehydration - IVF as noted above and repeat BMP in AM    Thrombocytopenia  - monitor for signs of bleeding - CBC in AM    Elevated troponin - ? 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 Increasing left suprahilar density. Persistent bulky paratracheal lymphadenopathy.   Code Status: Full Family Communication: Pt at bedside Disposition Plan: Admit for further evaluation    Mart Piggs [facility] [phone]   Review of Systems:  Unable to obtain due to AMs    Past Medical History  Diagnosis Date  . Other convulsions   . Small cell carcinoma of left  lung ([place]) 06/20/2015  . 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 Cholecystectomy      Social History:  reports that he has been smoking Cigarettes.  He has a 5 pack-year smoking history. He has never used smokeless tobacco. He reports that he does not drink alcohol or use illicit drugs.  No Known Allergies  Unable to obtain family history due to AMS  Prior to Admission medications   Medication Sig Start Date End Date Taking? Authorizing Provider   levETIRAcetam (KEPPRA) 500 MG tablet Take 1 tablet (500 mg total) by mouth 2 (two) times daily. 09/06/14  Yes Marcial Pacas, MD  oxyCODONE (ROXICODONE) 5 MG immediate release tablet Take 1 tablet (5 mg total) by mouth every 4 (four) hours as needed for severe pain. 06/20/15  Yes Curt Bears, MD  diazepam (VALIUM) 5 MG tablet Take 1 tablet (5 mg total) by mouth every 6 (six) hours as needed for muscle spasms. Patient not taking: Reported on 06/25/2015 06/01/15   Pierce Crane Beers, PA-C  gabapentin (NEURONTIN) 100 MG capsule Take 1 capsule (100 mg total) by mouth 3 (three) times daily. For 3 days, then increase to 2 tabs 3 times daily Patient not taking: Reported on 07/22/2015 06/01/15 05/31/16  Arlyss Repress, PA-C    Physical Exam: Filed Vitals:   07/21/2015 1149 07/13/2015 1230 06/28/2015 1233 07/10/2015 1307  BP: 88/72 92/67  109/65  Pulse: 129   104  Temp: 97.4 F (36.3 C)     Resp: '18 23  20  '$ Height:   '5\' 4"'$  (1.626 m)   Weight:   53.524 kg (118 lb)   SpO2: 95%   94%    Physical Exam  Constitutional: Appears ill, restless HENT: Normocephalic. External right and left ear normal. Dry MM Eyes: PERRLA, no scleral icterus.  Neck: Normal ROM. Neck supple. No JVD. No tracheal deviation. No thyromegaly.  CVS: RRR,  no gallops, no carotid bruit.  Pulmonary: rhonchi mostly on the left side  Abdominal: Soft. BS +, no tenderness, rebound or guarding.  Musculoskeletal: Normal range of motion. Lymphadenopathy: No lymphadenopathy noted, cervical, inguinal. Neuro: Alert. Restless, moving all 4 extremities spontaneously  Skin: Not diaphoretic. No erythema. No pallor.  Psychiatric: difficult to assess due to AMS  Labs on Admission:  Basic Metabolic Panel:  Recent Labs Lab 07/10/2015 1206  NA 149*  K 4.2  CL 103  CO2 28  GLUCOSE 119*  BUN 61*  CREATININE 1.91*  CALCIUM 13.0*   Liver Function Tests:  Recent Labs Lab 07/21/2015 1206  AST 66*  ALT 23  ALKPHOS 164*  BILITOT 1.2  PROT 7.5   ALBUMIN 2.5*   CBC:  Recent Labs Lab 07/01/2015 1206  WBC 6.6  NEUTROABS 4.6  HGB 13.9  HCT 42.0  MCV 89.2  PLT 76*   Cardiac Enzymes:  Recent Labs Lab 07/22/2015 1206  TROPONINI 0.74*   Pending   If 7PM-7AM, please contact night-coverage [email or web address] Password [email or web address] 07/19/2015, 1:45 PM

## 2015-06-30 NOTE — Progress Notes (Signed)
[facility] notified of lactic acid of 2.4.

## 2015-07-01 ENCOUNTER — Other Ambulatory Visit: Payer: Medicare Other

## 2015-07-01 ENCOUNTER — Ambulatory Visit: Payer: Medicare Other | Admitting: Internal Medicine

## 2015-07-01 LAB — TROPONIN I
TROPONIN I: 0.25 ng/mL — AB (ref <0.031)
TROPONIN I: 0.3 ng/mL — AB (ref <0.031)
TROPONIN I: 0.46 ng/mL — AB (ref <0.031)
Troponin I: 0.32 ng/mL — ABNORMAL HIGH (ref <0.031)

## 2015-07-01 LAB — CBC
HEMATOCRIT: 31.8 % — AB (ref 39.0–52.0)
HEMOGLOBIN: 10.1 g/dL — AB (ref 13.0–17.0)
MCH: 28.9 pg (ref 26.0–34.0)
MCHC: 31.8 g/dL (ref 30.0–36.0)
MCV: 90.9 fL (ref 78.0–100.0)
Platelets: 50 10*3/uL — ABNORMAL LOW (ref 150–400)
RBC: 3.5 MIL/uL — ABNORMAL LOW (ref 4.22–5.81)
RDW: 13.5 % (ref 11.5–15.5)
WBC: 3.9 10*3/uL — AB (ref 4.0–10.5)

## 2015-07-01 LAB — BASIC METABOLIC PANEL
ANION GAP: 9 (ref 5–15)
BUN: 47 mg/dL — AB (ref 6–20)
CHLORIDE: 117 mmol/L — AB (ref 101–111)
CO2: 26 mmol/L (ref 22–32)
Calcium: 11.1 mg/dL — ABNORMAL HIGH (ref 8.9–10.3)
Creatinine, Ser: 1.27 mg/dL — ABNORMAL HIGH (ref 0.61–1.24)
GFR, EST NON AFRICAN AMERICAN: 56 mL/min — AB (ref >60)
GLUCOSE: 83 mg/dL (ref 65–99)
POTASSIUM: 3.6 mmol/L (ref 3.5–5.1)
Sodium: 152 mmol/L — ABNORMAL HIGH (ref 135–145)

## 2015-07-01 LAB — LACTIC ACID, PLASMA: Lactic Acid, Venous: 2 mmol/L (ref 0.5–2.0)

## 2015-07-01 MED ORDER — ENSURE ENLIVE PO LIQD
237.0000 mL | Freq: Three times a day (TID) | ORAL | Status: DC
  Administered 2015-07-02: 237 mL via ORAL

## 2015-07-01 MED ORDER — DEXTROSE 5 % IV SOLN
INTRAVENOUS | Status: DC
  Administered 2015-07-01: 75 mL via INTRAVENOUS
  Administered 2015-07-02: 01:00:00 via INTRAVENOUS
  Administered 2015-07-04: 1000 mL via INTRAVENOUS

## 2015-07-01 NOTE — Care Management Note (Signed)
Case Management Note  Patient Details  Name: Edwin Mckay MRN: [id number] Date of Birth: 1945-12-26  Subjective/Objective:                 Sepsis, pna poss nstemi   Action/Plan   Date: July 01, 2015 Chart reviewed for concurrent status and case management needs. Will continue to follow patient for changes and needs: Velva Harman, RN, BSN, [place]   [phone]   Expected Discharge Date:   (UNKNOWN)               Expected Discharge Plan:  Home/Self Care  In-House Referral:  NA  Discharge planning Services  CM Consult  Post Acute Care Choice:  NA Choice offered to:  NA  DME Arranged:    DME Agency:     HH Arranged:    HH Agency:     Status of Service:  In process, will continue to follow  Medicare Important Message Given:    Date Medicare IM Given:    Medicare IM give by:    Date Additional Medicare IM Given:    Additional Medicare Important Message give by:     If discussed at [place] of Stay Meetings, dates discussed:    Additional Comments:  Leeroy Cha, RN 07/01/2015, 10:39 AM

## 2015-07-01 NOTE — Progress Notes (Signed)
Patient ID: Edwin Mckay, male   DOB: 19-Jan-1946, 70 y.o.   MRN: [id number]  [facility] PROGRESS NOTE  Danyl Deems VHQ:[id number] DOB: Feb 27, 1946 DOA: 07/15/2015 PCP: Morton Peters, MD   Brief narrative:    70 y.o. 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 Julien Nordmann, presented today by family members for evaluation of progressive failure to thrive, weight loss over 25 lbs in the past month. Please note that pt is unable to provide any history and there is no family at bedside, most of the details obtained from ED doctor. No reported fevers, abd or urinary concerns.   In ED, pt noted to be confused, requiring restraints. VS notable for HR up to 129, RR up to 27, SBP in 80-90's. CXR with worsening airspace opacities in the left lobe c/w PNA. Blood work notable for Na 149, Cr 1.91, Ca 13, troponin 0.74 --> 0.62, lactic acid 5.37 --> 2.8 after fluids. TRH asked to admit for further evaluation.   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 Julien Nordmann, will notify him of pt's admission - d/w wife, extent of illness and critical nature of acute illness - pt made DNR, continue current efforts and reassess response in 24 hours - comfort is desired pre family if pt does not respond to current measures - PCT also consulted and recommendations are appreciated    Acute respiratory failure secondary to  PNA, LLL vs obstructive in pt with lung cancer - ABX as noted above - allow BD as needed - keep on oxygen via [place] to keep oxygen sat > 92%    Acute encephalopathy - secondary to acute illness - still restless and agitated this AM - comfort desired per family  - allow ativan as needed    Hypercalcemia - from dehydration and malignancy - continue with IVF, Ca level is trending down - BMP in AM   Hypernatremia - also from dehydration - IVF will be changed to D5 - BMP In AM   Pancytopenia  - monitor for signs of bleeding - CBC in AM   Elevated troponin - ? 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 2. Stable heavy metastatic burden in the chest and upper abdomen. 3. Increased extrinsic airway compression of the right upper lobe bronchus due to tumor. 4. Coronary, aortic arch, and branch vessel atherosclerotic vascular disease. 5. Emphysema. 6. Low-density blood pool, suggesting anemia.   Dg Chest Port 1 View 07/12/2015 Worsening of airspace opacity in the superior segment of the left lower lobe compatible with pneumonia, possibly postobstructive. Increasing left suprahilar density. Persistent bulky  paratracheal lymphadenopathy.   Medical Consultants:  PCT  Other Consultants:  None  IAnti-Infectives:   Vancomycin 1/9 --> Maxipime 1/9 -->  Faye Ramsay, MD  [facility] Pager [phone]  If 7PM-7AM, please contact night-coverage [email or web address] Password [email or web address] 07/01/2015, 11:45 AM   LOS: 1 day   HPI/Subjective: No events overnight. Still restless.   Objective: Filed Vitals:   07/01/15 0530 07/01/15 0600 07/01/15 0630 07/01/15 0800  BP: 125/61 105/61 113/58   Pulse:      Temp:    97.5 F (36.4 C)  TempSrc:    Oral  Resp: '19 18 19   '$ Height:      Weight:      SpO2: 99% 99% 95%     Intake/Output Summary (Last 24 hours) at 07/01/15 1145 Last data filed at 07/01/15 0600  Gross per 24 hour  Intake 1117.5 ml  Output    250 ml  Net  867.5 ml    Exam:   General:  Pt is restless   Cardiovascular: Regular rate and rhythm, no rubs, no gallops  Respiratory: Rhonchi bilaterally but worse at the LLL   Abdomen: Soft, non tender, non distended, bowel sounds present, no guarding  Extremities:  pulses DP and PT palpable bilaterally  Data Reviewed: Basic Metabolic Panel:  Recent Labs Lab 06/22/2015 1206 07/12/2015 1705 07/01/15 0415  NA 149*  --  152*  K 4.2  --  3.6  CL 103  --  117*  CO2 28  --  26  GLUCOSE 119*  --  83  BUN 61*  --  47*  CREATININE 1.91*  --  1.27*  CALCIUM 13.0*  --  11.1*  MG  --  2.3  --   PHOS  --  3.1  --    Liver Function Tests:  Recent Labs Lab 07/02/2015 1206  AST 66*  ALT 23  ALKPHOS 164*  BILITOT 1.2  PROT 7.5  ALBUMIN 2.5*   CBC:  Recent Labs Lab 07/10/2015 1206 07/01/15 0415  WBC 6.6 3.9*  NEUTROABS 4.6  --   HGB 13.9 10.1*  HCT 42.0 31.8*  MCV 89.2 90.9  PLT 76* 50*   Cardiac Enzymes:  Recent Labs Lab 06/26/2015 0006 06/24/2015 1206 07/21/2015 1705 07/01/15 0415  TROPONINI 0.46* 0.74* 0.62* 0.32*   Recent Results (from the past 240 hour(s))  MRSA PCR Screening     Status: None   Collection Time: 07/06/2015   2:57 PM  Result Value Ref Range Status   MRSA by PCR NEGATIVE NEGATIVE Final    Scheduled Meds: . ceFEPime (MAXIPIME) IV  1 g Intravenous Q24H  . enoxaparin (LOVENOX) injection  30 mg Subcutaneous Q24H  . levETIRAcetam  500 mg Intravenous BID  . vancomycin  500 mg Intravenous Q24H   Continuous Infusions: . sodium chloride 75 mL/hr at 07/05/2015 1900

## 2015-07-01 NOTE — Progress Notes (Addendum)
Initial Nutrition Assessment  DOCUMENTATION CODES:   Severe malnutrition in context of chronic illness  INTERVENTION:  -Ensure Enlive po TID, each supplement provides 350 kcal and 20 grams of protein -Magic cup TID with meals, each supplement provides 290 kcal and 9 grams of protein -RD to continue to monitor for needs  NUTRITION DIAGNOSIS:   Malnutrition related to poor appetite, catabolic illness, chronic illness as evidenced by severe depletion of muscle mass, severe depletion of body fat.  GOAL:   Patient will meet greater than or equal to 90% of their needs  MONITOR:   Labs, PO intake, I & O's, Skin  REASON FOR ASSESSMENT:   Consult Assessment of nutrition requirement/status  ASSESSMENT:   70 y.o. 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 Julien Nordmann, presented today by family members for evaluation of progressive failure to thrive, weight loss over 25 lbs in the past month. Please note that pt is unable to provide any history and there is no family at bedside, most of the details obtained from ED doctor. No reported fevers, abd or urinary concerns.   Attempted to speak with pt at bedside but AMS prevented such. Per chart, pt as had 25# wt loss in 1 month. Per chart, patient has suffered a 35#/24% severe wt loss in the past month. Recent diagnosis of widespread metastatic lung cancer has likely increased pt needs tremendously. Pt is also suffering from Sepsis related to LLL PNA. Possible NSTEMI or demand ischemia. Per chart, comfort care will be done at this time.  Nutrition-Focused physical exam completed. Findings are severe fat depletion, severe muscle depletion, and no edema.   Labs and Medications reviewed.  Diet  Order:  Diet regular Room service appropriate?: Yes; Fluid consistency:: Thin  Skin:  Reviewed, no issues  Last BM:  PTA  Height:   Ht Readings from Last 1 Encounters:  07/12/2015 '5\' 4"'$  (1.626 m)    Weight:   Wt Readings from Last 1 Encounters:  06/29/2015 110 lb 0.2 oz (49.9 kg)    Ideal Body Weight:  59.09 kg  BMI:  Body mass index is 18.87 kg/(m^2).  Estimated Nutritional Needs:   Kcal:  2000-2250  Protein:  65-80 grams  Fluid:  >/= 2L  EDUCATION NEEDS:   No education needs identified at this time  Satira Anis. Rodriques Badie, MS, RD LDN After Hours/Weekend Pager [phone]

## 2015-07-02 DIAGNOSIS — C801 Malignant (primary) neoplasm, unspecified: Secondary | ICD-10-CM

## 2015-07-02 DIAGNOSIS — Z66 Do not resuscitate: Secondary | ICD-10-CM

## 2015-07-02 DIAGNOSIS — R0600 Dyspnea, unspecified: Secondary | ICD-10-CM

## 2015-07-02 DIAGNOSIS — C799 Secondary malignant neoplasm of unspecified site: Secondary | ICD-10-CM | POA: Insufficient documentation

## 2015-07-02 DIAGNOSIS — Z515 Encounter for palliative care: Secondary | ICD-10-CM

## 2015-07-02 LAB — BASIC METABOLIC PANEL
Anion gap: 10 (ref 5–15)
BUN: 40 mg/dL — AB (ref 6–20)
CHLORIDE: 114 mmol/L — AB (ref 101–111)
CO2: 25 mmol/L (ref 22–32)
CREATININE: 1.02 mg/dL (ref 0.61–1.24)
Calcium: 10.7 mg/dL — ABNORMAL HIGH (ref 8.9–10.3)
GFR calc Af Amer: >60 mL/min (ref >60)
GFR calc non Af Amer: >60 mL/min (ref >60)
GLUCOSE: 122 mg/dL — AB (ref 65–99)
POTASSIUM: 3.1 mmol/L — AB (ref 3.5–5.1)
Sodium: 149 mmol/L — ABNORMAL HIGH (ref 135–145)

## 2015-07-02 LAB — CBC
HCT: 30.8 % — ABNORMAL LOW (ref 39.0–52.0)
HEMOGLOBIN: 10 g/dL — AB (ref 13.0–17.0)
MCH: 29.2 pg (ref 26.0–34.0)
MCHC: 32.5 g/dL (ref 30.0–36.0)
MCV: 89.8 fL (ref 78.0–100.0)
PLATELETS: 42 10*3/uL — AB (ref 150–400)
RBC: 3.43 MIL/uL — AB (ref 4.22–5.81)
RDW: 13.4 % (ref 11.5–15.5)
WBC: 3.5 10*3/uL — ABNORMAL LOW (ref 4.0–10.5)

## 2015-07-02 LAB — TROPONIN I: TROPONIN I: 0.23 ng/mL — AB (ref <0.031)

## 2015-07-02 MED ORDER — VANCOMYCIN HCL 500 MG IV SOLR
500.0000 mg | Freq: Two times a day (BID) | INTRAVENOUS | Status: DC
  Filled 2015-07-02: qty 500

## 2015-07-02 MED ORDER — DEXTROSE 5 % IV SOLN
1.0000 g | Freq: Two times a day (BID) | INTRAVENOUS | Status: DC
  Administered 2015-07-02: 1 g via INTRAVENOUS
  Filled 2015-07-02: qty 1

## 2015-07-02 MED ORDER — POTASSIUM CHLORIDE 10 MEQ/100ML IV SOLN: 10.0000 meq | INTRAVENOUS | Status: DC

## 2015-07-02 MED ORDER — LORAZEPAM 1 MG PO TABS
1.0000 mg | ORAL_TABLET | ORAL | Status: DC | PRN
  Administered 2015-07-02: 1 mg via ORAL
  Filled 2015-07-02: qty 1

## 2015-07-02 MED ORDER — MORPHINE SULFATE (CONCENTRATE) 10 MG/0.5ML PO SOLN
5.0000 mg | ORAL | Status: DC | PRN
  Administered 2015-07-04: 5 mg via ORAL
  Filled 2015-07-02: qty 0.5

## 2015-07-02 NOTE — Consult Note (Signed)
Consultation Note Date: 07/02/2015   Patient Name: Edwin Mckay  DOB: [birth year]  MRN: [id number]  Age / Sex: 70 y.o., male  PCP: Morton Peters., MD Referring Physician: Theodis Blaze, MD  Reason for Consultation: Establishing goals of care, Non pain symptom management, Pain control and Psychosocial/spiritual support  Clinical Assessment/Narrative:  70 y.o. 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 Julien Nordmann, admitted for  progressive failure to thrive, weight loss over 25 lbs in the past month.   Family faced with treatment option decisions and goals of care and anticipatory care needs.  This NP Wadie Lessen reviewed medical records, received report from team, assessed the patient and then meet at the patient's bedside along with his wife  to discuss diagnosis,  prognosis, GOC, EOL wishes disposition and options.  A  discussion was had today regarding advanced directives.  Concepts specific to code status, artifical feeding and hydration, continued IV antibiotics and rehospitalization was had.  The difference between a aggressive medical intervention path  and a palliative comfort care path for this patient at this time was had.  Values and goals of care important to patient and family were attempted to be elicited.  Family clearly understand the limited prognosis and hope is for comfort at home with hospice services  Concept of Hospice and Palliative Care were discussed  Natural trajectory and expectations at EOL were discussed.  Questions and concerns addressed.  Family encouraged to call with questions or concerns.  PMT will continue to support  holistically.   Primary Decision Maker: wife   Code Status/Advance Care Planning: DNR    Code Status Orders        Start     Ordered   07/01/2015 2006  Do not attempt resuscitation (DNR)   Continuous    Question Answer Comment  In the event of cardiac or respiratory ARREST Do not call a "code blue"   In the event of cardiac or respiratory ARREST Do not perform Intubation, CPR, defibrillation or ACLS   In the event of cardiac or respiratory ARREST Use medication by any route, position, wound care, and other measures to relive pain and suffering. May use oxygen, suction and manual treatment of airway obstruction as needed for comfort.      07/07/2015 2005    Code Status History    Date Active Date Inactive Code Status Order ID Comments User Context   07/06/2015  4:25 PM 07/18/2015  8:05 PM Full Code [id number]  Theodis Blaze, MD Inpatient   06/10/2015 12:11 AM 06/12/2015  3:48 PM Full Code [id number]  Lily Kocher, MD Inpatient      Other Directives:None  Symptom Management:   Pain/Dyspnea: Roxanol 5 mg po/sl every 2 hrs prn  Agitation: Ativan 1 mg every 4 hrs prn po/sl   Palliative Prophylaxis:   Aspiration, Bowel Regimen, Delirium Protocol, Frequent Pain Assessment, Oral Care and Turn Reposition  Additional Recommendations (Limitations, Scope, Preferences):   Full Comfort Care  Psycho-social/Spiritual:  Support System: [place] Desire for further Chaplaincy support:no Additional Recommendations: Education on Hospice  Prognosis: < 2 weeks  Discharge Planning: Home with Hospice, will write for choice   Chief Complaint/ Primary Diagnoses: Present on Admission:  . Sepsis ([place]) . Small cell carcinoma of left  lung ([place]) . Seizure disorder, complex partial ([place]) . Hypercalcemia . Hypernatremia . Elevated troponin . Thrombocytopenia ([place])  I have reviewed the medical record, interviewed the patient and family, and examined the patient. The following aspects are  pertinent.  Past Medical History  Diagnosis Date  . Other convulsions   . Small cell carcinoma of left lung ([place]) 06/20/2015  . Small cell carcinoma of left lung ([place]) 06/20/2015   Social History   Social History  . Marital Status: Married    Spouse Name: Olegario Shearer  . Number of Children: 2  . Years of Education: 9th    Social History Main Topics  . Smoking status: Current Every Day Smoker -- 0.25 packs/day for 20 years    Types: Cigarettes  . Smokeless tobacco: Never Used  . Alcohol Use: No  . Drug Use: No  . Sexual Activity: Not Currently   Other Topics Concern  . None   Social History Narrative   Patient lives at home with wife and son.    Patient has a 9th grade education.    Patient has 2 children.          No family history on file. Scheduled Meds: . feeding supplement (ENSURE ENLIVE)  237 mL Oral TID BM  . levETIRAcetam  500 mg Intravenous BID   Continuous Infusions: . dextrose 10 mL (07/02/15 1332)   PRN Meds:.HYDROmorphone (DILAUDID) injection, ipratropium-albuterol, LORazepam, LORazepam, morphine CONCENTRATE, ondansetron **OR** ondansetron (ZOFRAN) IV Medications Prior to Admission:  Prior to Admission medications   Medication Sig Start Date End Date Taking? Authorizing Provider  levETIRAcetam (KEPPRA) 500 MG tablet Take 1 tablet (500 mg total) by mouth 2 (two) times daily. 09/06/14  Yes Marcial Pacas, MD  oxyCODONE (ROXICODONE) 5 MG immediate release tablet Take 1 tablet (5 mg total) by mouth every 4 (four) hours as needed for severe pain. 06/20/15  Yes Curt Bears, MD  diazepam (VALIUM) 5 MG tablet Take 1 tablet (5 mg total) by mouth every 6 (six) hours as needed for muscle spasms. Patient not taking: Reported on 07/09/2015 06/01/15   Pierce Crane Beers, PA-C  gabapentin (NEURONTIN) 100 MG capsule Take 1 capsule (100 mg total) by mouth 3 (three) times daily. For 3 days, then increase to 2 tabs 3 times daily Patient not taking: Reported on 07/17/2015 06/01/15 05/31/16   Pierce Crane Beers, PA-C   No Known Allergies  Review of Systems  Unable to perform ROS   Physical Exam  Constitutional: He appears lethargic. He appears cachectic.  Cardiovascular: Tachycardia present.   Respiratory: He has decreased breath sounds in the right lower field and the left lower field. He has rhonchi.  Neurological: He appears lethargic. He displays atrophy.  Skin: Skin is warm and dry.    Vital Signs: BP 118/63 mmHg  Pulse 99  Temp(Src) 98.2 F (36.8 C) (Oral)  Resp 22  Ht '5\' 4"'$  (1.626 m)  Wt 51.4 kg (113 lb 5.1 oz)  BMI 19.44 kg/m2  SpO2 97%  SpO2: SpO2: 97 % O2 Device:SpO2: 97 % O2 Flow Rate: .O2 Flow Rate (L/min): 3 L/min  IO: Intake/output summary:  Intake/Output Summary (Last 24 hours) at 07/02/15 1638 Last data filed at 07/02/15 0600  Gross per 24 hour  Intake   1150 ml  Output    200 ml  Net    950 ml    LBM:   Baseline Weight: Weight: 53.524 kg (118 lb) Most recent weight: Weight: 51.4 kg (113 lb 5.1 oz)  Palliative Assessment/Data:  Flowsheet Rows        Most Recent Value   Intake Tab    Referral Department  Hospitalist   Unit at Time of Referral  ICU   Palliative Care Primary Diagnosis  Cancer   Date Notified  06/29/2015   Palliative Care Type  New Palliative care   Reason for referral  Clarify Goals of Care   Date of Admission  07/01/2015   # of days IP prior to Palliative referral  0   Clinical Assessment    Psychosocial & Spiritual Assessment    Palliative Care Outcomes       Additional Data Reviewed:  CBC:    Component Value Date/Time   WBC 3.5* 07/02/2015 0420   WBC 8.4 06/20/2015 0824   HGB 10.0* 07/02/2015 0420   HGB 13.9 06/20/2015 0824   HCT 30.8* 07/02/2015 0420   HCT 40.6 06/20/2015 0824   PLT 42* 07/02/2015 0420   PLT 131* 06/20/2015 0824   MCV 89.8 07/02/2015 0420   MCV 86.9 06/20/2015 0824   NEUTROABS 4.6 07/08/2015 1206   NEUTROABS 5.7 06/20/2015 0824   LYMPHSABS 1.7 06/23/2015 1206   LYMPHSABS 2.2  06/20/2015 0824   MONOABS 0.3 07/20/2015 1206   MONOABS 0.3 06/20/2015 0824   EOSABS 0.0 07/21/2015 1206   EOSABS 0.0 06/20/2015 0824   BASOSABS 0.0 06/28/2015 1206   BASOSABS 0.0 06/20/2015 0824   Comprehensive Metabolic Panel:    Component Value Date/Time   NA 149* 07/02/2015 0420   NA 138 06/20/2015 0824   K 3.1* 07/02/2015 0420   K 4.5 06/20/2015 0824   CL 114* 07/02/2015 0420   CO2 25 07/02/2015 0420   CO2 25 06/20/2015 0824   BUN 40* 07/02/2015 0420   BUN 36.6* 06/20/2015 0824   CREATININE 1.02 07/02/2015 0420   CREATININE 1.6* 06/20/2015 0824   GLUCOSE 122* 07/02/2015 0420   GLUCOSE 235* 06/20/2015 0824   CALCIUM 10.7* 07/02/2015 0420   CALCIUM 11.6* 06/20/2015 0824   AST 66* 07/07/2015 1206   AST 70* 06/20/2015 0824   ALT 23 07/18/2015 1206   ALT 20 06/20/2015 0824   ALKPHOS 164* 07/11/2015 1206   ALKPHOS 175* 06/20/2015 0824   BILITOT 1.2 06/22/2015 1206   BILITOT 0.57 06/20/2015 0824   PROT 7.5 07/01/2015 1206   PROT 7.5 06/20/2015 0824   ALBUMIN 2.5* 07/17/2015 1206   ALBUMIN 2.7* 06/20/2015 0824   Left VM for Dr Doyle Askew  Time In: 1130 Time Out: 1505 Time Total: 75 min Greater than 50%  of this time was spent counseling and coordinating care related to the above assessment and plan.  Signed by: Wadie Lessen, NP  Knox Royalty, NP  07/02/2015, 4:38 PM  Please contact Palliative Medicine Team phone at [phone] for questions and concerns.

## 2015-07-02 NOTE — Progress Notes (Signed)
Pharmacy Antibiotic Follow-up Note  Edwin Mckay is a 70 y.o. year-old male admitted on 07/06/2015.  The patient is currently on day #3 of vancomycin/cefepime for sepsis d/t pneumonia.  Patient has history of recently dx SCLC.  Renal function has improved since admission.   Assessment/Plan:  Based on improved renal function  Change to vancomycin '500mg'$  IV q12h  Suggest stop vancomycin in next 24h as MRSA PCR is negative, blood cultures read No growth at < 24h  Change cefepime to 1gm IV q12h  Temp (24hrs), Avg:98.5 F (36.9 C), Min:98.1 F (36.7 C), Max:98.9 F (37.2 C)   Recent Labs Lab 06/27/2015 1206 07/01/15 0415 07/02/15 0420  WBC 6.6 3.9* 3.5*    Recent Labs Lab 07/19/2015 1206 07/01/15 0415 07/02/15 0420  CREATININE 1.91* 1.27* 1.02   Estimated Creatinine Clearance: 49 mL/min (by C-G formula based on Cr of 1.02).    No Known Allergies  Antimicrobials this admission: 1/9 Vanc >>  1/9 Cefepime >>   Levels/dose changes this admission: 1/11 for improved renal function change to vanco '500mg'$  q12h and cefepime 1gm q12h  Microbiology Results: 12/19 CoNS 1/2 1/9 BCx: NGTD 1/9 MRSA neg  Thank you for allowing pharmacy to be a part of this patient's care.  Doreene Eland, PharmD, BCPS.   Pager: [phone] 07/02/2015 9:58 AM

## 2015-07-02 NOTE — Progress Notes (Signed)
Patient ID: Edwin Mckay, male   DOB: [birth year], 70 y.o.   MRN: [id number]  [facility] PROGRESS NOTE  Edwin Mckay WUJ:[id number] DOB: October 12, 1945 DOA: 07/20/2015 PCP: Morton Peters, MD   Brief narrative:    70 y.o. 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 Julien Nordmann, presented today by family members for evaluation of progressive failure to thrive, weight loss over 25 lbs in the past month. Please note that pt is unable to provide any history and there is no family at bedside, most of the details obtained from ED doctor. No reported fevers, abd or urinary concerns.   In ED, pt noted to be confused, requiring restraints. VS notable for HR up to 129, RR up to 27, SBP in 80-90's. CXR with worsening airspace opacities in the left lobe c/w PNA. Blood work notable for Na 149, Cr 1.91, Ca 13, troponin 0.74 --> 0.62, lactic acid 5.37 --> 2.8 after fluids. TRH asked to admit for further evaluation.   Assessment/Plan:    Assessment and Plan:  Principal Problem:  Sepsis ([place]) secondary to LLL PNA, unknown pathogen  - admitted to SDU - continue Vancomycin and Maxipime day #3 - follow up sputum, blood, urine cultures - narrow ABX as clinically indicated   Active Problems:  Small cell carcinoma of left lung ([facility]) with metastasis to bones, LN, liver - follows with Dr. Julien Nordmann, will notify him of pt's admission - d/w wife, extent of illness and critical nature of acute illness - pt made DNR, continue current efforts and reassess response in 24 hours - comfort is desired pre family if pt does not respond to current measures - PCT also consulted and recommendations are appreciated, meeting scheduled for today    Acute  respiratory failure secondary to PNA, LLL vs obstructive in pt with lung cancer - ABX as noted above - allow BD as needed - keep on oxygen via [place] to keep oxygen sat > 92%    Acute encephalopathy - secondary to acute illness - more calm this AM  - comfort desired per family  - allow ativan as needed    Hypercalcemia - from dehydration and malignancy - continue with IVF, Ca level is trending down - BMP in AM   Hypernatremia - also from dehydration - Na trending down with D5, will continue same regimen  - BMP In AM   Pancytopenia  - drop in Plt, stop Lovenox SQ and place on SCD's  - CBC in AM    Hypokalemia - supplement and repeat BMP in AM   Elevated troponin - ? 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 2. Stable heavy metastatic burden in the chest and upper abdomen. 3. Increased extrinsic airway compression of the right upper lobe bronchus due to tumor. 4. Coronary, aortic arch, and branch vessel atherosclerotic vascular disease. 5. Emphysema. 6. Low-density blood pool, suggesting anemia.   Dg Chest Port 1  View 07/07/15 Worsening of airspace opacity in the superior segment of the left lower lobe compatible with pneumonia, possibly postobstructive. Increasing left suprahilar density. Persistent bulky paratracheal lymphadenopathy.   Medical Consultants:  PCT  Other Consultants:  None  IAnti-Infectives:   Vancomycin 06-Jul-2022 --> Maxipime July 06, 2022 -->  Faye Ramsay, MD  [facility] Pager [phone]  If 7PM-7AM, please contact night-coverage [email or web address] Password [facility] 07/02/2015, 12:41 PM   LOS: 2 days   HPI/Subjective: No events overnight. More calm, sleeping   Objective: Filed Vitals:   07/02/15 0202 07/02/15 0400 07/02/15 0500 07/02/15 0800  BP: 135/71 119/61 118/63   Pulse:      Temp:  98.9 F (37.2 C)  98.4 F (36.9 C)  TempSrc:  Axillary  Oral  Resp: '27 22 22   '$ Height:      Weight:  51.4 kg (113 lb 5.1 oz)    SpO2: 98% 96% 97%     Intake/Output Summary (Last 24 hours) at 07/02/15 1241 Last data filed at 07/02/15 0600  Gross per 24 hour  Intake   1440 ml  Output    200 ml  Net   1240 ml    Exam:   General:  Pt is sleeping   Cardiovascular: Regular rate and rhythm, no rubs, no gallops  Respiratory: Rhonchi bilaterally somewhat improved   Abdomen: Soft, non tender, non distended, bowel sounds present, no guarding  Extremities:  pulses DP and PT palpable bilaterally  Data Reviewed: Basic Metabolic Panel:  Recent Labs Lab 07-Jul-2015 1206 07/07/2015 1705 07/01/15 0415 07/02/15 0420  NA 149*  --  152* 149*  K 4.2  --  3.6 3.1*  CL 103  --  117* 114*  CO2 28  --  26 25  GLUCOSE 119*  --  83 122*  BUN 61*  --  47* 40*  CREATININE 1.91*  --  1.27* 1.02  CALCIUM 13.0*  --  11.1* 10.7*  MG  --  2.3  --   --   PHOS  --  3.1  --   --    Liver Function Tests:  Recent Labs Lab 07/07/2015 1206  AST 66*  ALT 23  ALKPHOS 164*  BILITOT 1.2  PROT 7.5  ALBUMIN 2.5*   CBC:  Recent Labs Lab 07/07/15 1206 07/01/15 0415 07/02/15 0420  WBC 6.6 3.9* 3.5*   NEUTROABS 4.6  --   --   HGB 13.9 10.1* 10.0*  HCT 42.0 31.8* 30.8*  MCV 89.2 90.9 89.8  PLT 76* 50* 42*   Cardiac Enzymes:  Recent Labs Lab 07-07-15 1705 07/01/15 0415 07/01/15 1255 07/01/15 2031 07/02/15 0055  TROPONINI 0.62* 0.32* 0.30* 0.25* 0.23*   Recent Results (from the past 240 hour(s))  MRSA PCR Screening     Status: None   Collection Time: 07-Jul-2015  2:57 PM  Result Value Ref Range Status   MRSA by PCR NEGATIVE NEGATIVE Final    Scheduled Meds: . feeding supplement (ENSURE ENLIVE)  237 mL Oral TID  BM  . levETIRAcetam  500 mg Intravenous BID   Continuous Infusions: . dextrose 75 mL/hr at 07/02/15 0034

## 2015-07-02 NOTE — Progress Notes (Signed)
Transfer note:  Asked by Charge RN Leona Carry) to evaluate pt for possible transfer out of SDU since now comfort care. Per record, pt's wife met w/ Palliative Care Team this afternoon. Per note by Burtis Junes, NP w/ Palliative Care Team wife understands poor prognosis and wishes for comfort care and hopes for pt to be d/c'd home w/ hospice services. I spoke w/ pt's wife Tourist information centre manager) by phone. Discussed w/ her the possibility of transferring Mr. Tsang out of SDU. Explained that pt's BP remains quite low and that if we moved pt to the floor we would not aggressively treat BP w/ meds or IV boluses and that this may hasten his death. Wife verbalizes understanding and confirms desire for full comfort care. Reassured wife we would continue focus on comfort and symptom management. At bedside pt noted lying quietly in bed in NAD. He at times will say his name but is otherwise non-verbal. Current VS BP- 70's/30's, HR-90-105, RR-26-31 w/ 02 sats of 92-98% on [place]. Discussed pt and conversation w/ spouse w/ my colleague Dr Blaine Hamper who agrees that it is appropriate to transfer pt out of SDU. Will focus on pt's comfort and defer medications changes/discontinuations to rounding MD.   Jeryl Columbia, NP-C [facility] Pager [phone]

## 2015-07-02 NOTE — Progress Notes (Signed)
Date: July 02, 2015 Chart reviewed for concurrent status and case management needs. Will continue to follow patient for changes and needs:  Elevated labs, positive cxr for pna, o2 needs, hypotensive with sepsis Velva Harman, RN, BSN, [place]   [phone]

## 2015-07-03 NOTE — Care Management Important Message (Signed)
Important Message  Patient Details  Name: Edwin Mckay MRN: [id number] Date of Birth: February 23, 1946   Medicare Important Message Given:  Yes    Camillo Flaming 07/03/2015, 10:46 AMImportant Message  Patient Details  Name: Edwin Mckay MRN: [id number] Date of Birth: [birth year]   Medicare Important Message Given:  Yes    Camillo Flaming 07/03/2015, 10:45 AM

## 2015-07-03 NOTE — Progress Notes (Signed)
Nutrition Brief Note  Chart reviewed. Pt now transitioning to comfort care.  No further nutrition interventions warranted at this time.  Please re-consult as needed.   Clayton Bibles, MS, RD, LDN Pager: [phone] After Hours Pager: [phone]

## 2015-07-03 NOTE — Progress Notes (Signed)
Patient ID: Edwin Mckay, male   DOB: Dec 11, 1945, 70 y.o.   MRN: [id number]  [facility] PROGRESS NOTE  [place] QAS:[id number] DOB: 1946/04/20 DOA: July 07, 2015 PCP: Morton Peters, MD   Brief narrative:    70 y.o. 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 Julien Nordmann, presented today by family members for evaluation of progressive failure to thrive, weight loss over 25 lbs in the past month. Please note that pt is unable to provide any history and there is no family at bedside, most of the details obtained from ED doctor. No reported fevers, abd or urinary concerns.   In ED, pt noted to be confused, requiring restraints. VS notable for HR up to 129, RR up to 27, SBP in 80-90's. CXR with worsening airspace opacities in the left lobe c/w PNA. Blood work notable for Na 149, Cr 1.91, Ca 13, troponin 0.74 --> 0.62, lactic acid 5.37 --> 2.8 after fluids. TRH asked to admit for further evaluation.   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 Julien Nordmann, will notify him of pt's admission - d/w wife, extent of illness and critical nature of acute illness - pt made DNR, comfort is desired pre family - PCT also consulted and recommendations are appreciated   Acute respiratory failure secondary to PNA, LLL vs obstructive in pt with lung cancer -  keep on oxygen via [place] to keep oxygen sat > 92%    Acute encephalopathy - secondary to acute illness - more calm this AM  - comfort desired per family    Hypercalcemia - from dehydration and malignancy - improved per numbers but pt not improving clinically    Hypernatremia - also from dehydration - Na trending down with D5 but again, pt not improving despite numbers looking better    Pancytopenia  - drop in Plt, stopped Lovenox SQ and placed on SCD's     Hypokalemia - no further blood work to ensure comfort    Elevated troponin - ? 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 2. Stable heavy metastatic burden in the chest and upper abdomen. 3. Increased extrinsic airway compression of the right upper lobe bronchus due to tumor. 4. Coronary, aortic arch, and branch vessel atherosclerotic vascular disease. 5. Emphysema. 6. Low-density blood pool, suggesting anemia.   Dg Chest Port 1 View 07/20/2015 Worsening of airspace opacity in the superior segment of the left lower lobe compatible with pneumonia, possibly postobstructive.  Increasing left suprahilar density. Persistent bulky paratracheal lymphadenopathy.   Medical Consultants:  PCT  Other Consultants:  None  IAnti-Infectives:   Vancomycin 1/9 --> 1/12 Maxipime 1/9 --> /12  Faye Ramsay, MD  [facility] Pager [phone]  If 7PM-7AM, please contact night-coverage [email or web address] Password [facility] 07/03/2015, 11:34 AM   LOS: 3 days   HPI/Subjective: No events overnight. More calm, sleeping   Objective: Filed Vitals:   07/02/15 2156 07/02/15 2300 07/03/15 0002 07/03/15 0011  BP: 118/69   88/44  Pulse:    101  Temp:   98 F (36.7 C) 98 F (36.7 C)  TempSrc:   Axillary Axillary  Resp:  30    Height:      Weight:      SpO2:  92%  98%    Intake/Output Summary (Last 24 hours) at 07/03/15 1134 Last data filed at 07/03/15 1109  Gross per 24 hour  Intake 384.67 ml  Output    650 ml  Net -265.33 ml    Exam:   General:  Pt is sleeping   Cardiovascular: Regular rate and rhythm, no rubs, no gallops  Respiratory: Rhonchi bilaterally somewhat improved   Abdomen: Soft, non tender, non distended, bowel sounds present, no guarding  Extremities:  pulses DP and PT palpable bilaterally  Data Reviewed: Basic Metabolic Panel:  Recent Labs Lab 07/21/2015 1206 07/05/2015 1705 07/01/15 0415 07/02/15 0420  NA 149*  --  152* 149*  K 4.2  --  3.6 3.1*  CL 103  --  117* 114*  CO2 28  --  26 25  GLUCOSE 119*  --  83 122*  BUN 61*  --  47* 40*  CREATININE 1.91*  --  1.27* 1.02  CALCIUM 13.0*  --  11.1* 10.7*  MG  --  2.3  --   --   PHOS  --  3.1  --   --    Liver Function Tests:  Recent Labs Lab 06/26/2015 1206  AST 66*  ALT 23  ALKPHOS 164*  BILITOT 1.2  PROT 7.5  ALBUMIN 2.5*   CBC:  Recent Labs Lab 07/08/2015 1206 07/01/15 0415 07/02/15 0420  WBC 6.6 3.9* 3.5*  NEUTROABS 4.6  --   --   HGB 13.9 10.1* 10.0*  HCT 42.0 31.8* 30.8*  MCV 89.2 90.9 89.8  PLT 76* 50* 42*   Cardiac Enzymes:  Recent Labs Lab 07/15/2015 1705  07/01/15 0415 07/01/15 1255 07/01/15 2031 07/02/15 0055  TROPONINI 0.62* 0.32* 0.30* 0.25* 0.23*   Recent Results (from the past 240 hour(s))  MRSA PCR Screening     Status: None   Collection Time: 07/07/2015  2:57 PM  Result Value Ref Range Status   MRSA by PCR NEGATIVE NEGATIVE Final    Scheduled Meds: . feeding supplement (ENSURE ENLIVE)  237 mL Oral TID BM  . levETIRAcetam  500 mg Intravenous BID   Continuous Infusions: . dextrose 10 mL (07/02/15 1332)

## 2015-07-03 NOTE — Progress Notes (Signed)
Pt has had family in and out a few times today. Pt spent most of the day sleeping, but would arouse when spoken to loudly or touched. Providing comfort care as needed. Son currently at bedside and pain medication was just administered. Will continue to monitor

## 2015-07-03 NOTE — Care Management Note (Signed)
Case Management Note  Patient Details  Name: Edwin Mckay MRN: [id number] Date of Birth: Mar 20, 1946  Subjective/Objective:     70 yo admitted with Sepsis. Hx of lung CA               Action/Plan: From home with wife and support of 2 sons.  Expected Discharge Date:   (UNKNOWN)               Expected Discharge Plan:  Home w Hospice Care  In-House Referral:  NA  Discharge planning Services  CM Consult  Post Acute Care Choice:  Hospice Choice offered to:  Spouse  DME Arranged:    DME Agency:     HH Arranged:  Disease Management [place] Agency:  [facility]  Status of Service:  In process, will continue to follow  Medicare Important Message Given:  Yes Date Medicare IM Given:    Medicare IM give by:    Date Additional Medicare IM Given:    Additional Medicare Important Message give by:     If discussed at [place] of Stay Meetings, dates discussed:    Additional Comments: This CM met with pt, wife and son at bedside. Pt confused and sleeping off and on. Wife states that they had [facility] services prior to admission but now would like to have home hospice services. Wife chooses [facility], and state that a hospital bed and 02 would be needed at home. This CM called and faxed referral to [facility]. Plan for DC tomorrow per MD. Family would like pt to be transported home via ambulance and CSW made aware. Lynnell Catalan, RN 07/03/2015, 12:39 PM

## 2015-07-04 DIAGNOSIS — N179 Acute kidney failure, unspecified: Secondary | ICD-10-CM

## 2015-07-04 DIAGNOSIS — E860 Dehydration: Secondary | ICD-10-CM | POA: Insufficient documentation

## 2015-07-04 MED ORDER — LORAZEPAM 1 MG PO TABS: 1.0000 mg | ORAL_TABLET | ORAL | Status: AC | PRN

## 2015-07-04 MED ORDER — DIAZEPAM 5 MG PO TABS: 5.0000 mg | ORAL_TABLET | Freq: Four times a day (QID) | ORAL | Status: AC | PRN

## 2015-07-04 MED ORDER — IPRATROPIUM-ALBUTEROL 0.5-2.5 (3) MG/3ML IN SOLN: 3.0000 mL | RESPIRATORY_TRACT | Status: AC | PRN

## 2015-07-04 MED ORDER — MORPHINE SULFATE (CONCENTRATE) 10 MG/0.5ML PO SOLN: 5.0000 mg | ORAL | Status: AC | PRN

## 2015-07-05 LAB — CULTURE, BLOOD (ROUTINE X 2)
CULTURE: NO GROWTH
CULTURE: NO GROWTH

## 2015-07-09 ENCOUNTER — Other Ambulatory Visit: Payer: Medicare Other

## 2015-07-16 ENCOUNTER — Ambulatory Visit: Payer: Medicare Other

## 2015-07-16 ENCOUNTER — Other Ambulatory Visit: Payer: Medicare Other

## 2015-07-16 ENCOUNTER — Ambulatory Visit: Payer: Medicare Other | Admitting: Internal Medicine

## 2015-07-17 ENCOUNTER — Ambulatory Visit: Payer: Medicare Other

## 2015-07-18 ENCOUNTER — Ambulatory Visit: Payer: Medicare Other

## 2015-07-19 ENCOUNTER — Ambulatory Visit: Payer: Medicare Other

## 2015-07-23 ENCOUNTER — Other Ambulatory Visit: Payer: Medicare Other

## 2015-07-23 NOTE — Discharge Summary (Signed)
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 Julien Nordmann, presented today by family members for evaluation of progressive failure to thrive, weight loss over 25 lbs in the past month. Please note that pt is unable to provide any history and there is no family at bedside, most of the details obtained from ED doctor. No reported fevers, abd or urinary concerns.   In ED, pt noted to be confused, requiring restraints. VS notable for HR up to 129, RR up to 27, SBP in 80-90's. CXR with worsening airspace opacities in the left lobe c/w PNA. Blood work notable for Na 149, Cr 1.91, Ca 13, troponin 0.74 --> 0.62, lactic acid 5.37 --> 2.8 after fluids. TRH asked to admit for further evaluation.   Assessment/Plan:    Assessment and Plan:  Principal Problem:  Sepsis ([place]) secondary to LLL PNA, unknown pathogen  - admitted to SDU - was on vancomycin and maxipime but continued to deteriorate, d/w family and they met with our PCT - decision made to allow full comfort - pt passed away with no signs of struggle, family at bedside, emotional support given   Active  Problems:  Small cell carcinoma of left lung (HCC) with metastasis to bones, LN, liver - follows with Dr. Julien Nordmann, will notify him of pt's admission - d/w wife, extent of illness and critical nature of acute illness - pt made DNR, comfort allowed    Acute respiratory failure secondary to PNA, LLL vs obstructive in pt with lung cancer   Acute encephalopathy - secondary to acute illness   Hypercalcemia - from dehydration and malignancy   Hypernatremia - also from dehydration - Na trending down with D5 but again, pt not improving despite numbers looking better    Pancytopenia  - drop in Plt, stopped Lovenox SQ    Hypokalemia - no further blood work to ensure comfort    Elevated troponin - NSTEMI, demand ischemia - no further invasive interventions   Seizure disorder, complex partial (HCC) - no seizures here    Severe PCM  - in the context of progressive illness  Code Status: DNR Family Communication: plan of care discussed with two sons and wife at bedside  IV access:  Peripheral IV  Procedures and diagnostic studies:   Ct Chest Wo Contrast Jul 26, 2015 The dominant change is increase in airspace opacity in the left lower lobe, probably from postobstructive pneumonia/pneumonitis, less likely from pulmonary hemorrhage. 2. Stable heavy metastatic burden in the chest and upper abdomen. 3. Increased extrinsic airway compression of the right upper lobe bronchus due to tumor. 4. Coronary, aortic arch, and branch vessel atherosclerotic vascular disease. 5. Emphysema.  6. Low-density blood pool, suggesting anemia.   Dg Chest Port 1 View 07/07/2015 Worsening of airspace opacity in the superior segment of the left lower lobe compatible with pneumonia, possibly postobstructive. Increasing left suprahilar density. Persistent bulky paratracheal lymphadenopathy.   Medical Consultants:  PCT  Other Consultants:  None  IAnti-Infectives:   Vancomycin 1/9 -->  1/12 Maxipime 1/9 --> /12      Signed:  MAGICK-Deago Burruss  [facility] 07/05/2015, 8:49 PM

## 2015-07-23 NOTE — Progress Notes (Signed)
Patient expired at 1510, 2 nurses  This Probation officer and Genia Hotter verified the death. Dr. Doyle Askew was notified. Wife,Vicky aware of patient's death. She will come to see the patient. Endorsed Naval architect.

## 2015-07-23 NOTE — Progress Notes (Signed)
Discharge instructions given to wife, verbalized understanding. Patient is sleeping, mouth care provided, also turned and repoitioned for comfort.

## 2015-07-23 NOTE — Progress Notes (Signed)
CSW received notification that pt needing ambulance transport to home with hospice.  CSW confirmed address with pt wife via telephone. CSW arranged ambulance transport for pt to home.   CSW arranged ambulance transport for pt to home.   No further social work needs identified at this time.  CSW signing off.   Alison Murray, MSW, [place] [facility] [phone]

## 2015-07-23 NOTE — Discharge Instructions (Signed)
Acute Kidney Injury Acute kidney injury is any condition in which there is sudden (acute) damage to the kidneys. Acute kidney injury was previously known as acute kidney failure or acute renal failure. The kidneys are two organs that lie on either side of the spine between the middle of the back and the front of the abdomen. The kidneys:  Remove wastes and extra water from the blood.   Produce important hormones. These help keep bones strong, regulate blood pressure, and help create red blood cells.   Balance the fluids and chemicals in the blood and tissues. A small amount of kidney damage may not cause problems, but a large amount of damage may make it difficult or impossible for the kidneys to work the way they should. Acute kidney injury may develop into long-lasting (chronic) kidney disease. It may also develop into a life-threatening disease called end-stage kidney disease. Acute kidney injury can get worse very quickly, so it should be treated right away. Early treatment may prevent other kidney diseases from developing. CAUSES   A problem with blood flow to the kidneys. This may be caused by:   Blood loss.   Heart disease.   Severe burns.   Liver disease.  Direct damage to the kidneys. This may be caused by:  Some medicines.   A kidney infection.   Poisoning or consuming toxic substances.   A surgical wound.   A blow to the kidney area.   A problem with urine flow. This may be caused by:   Cancer.   Kidney stones.   An enlarged prostate. SIGNS AND SYMPTOMS   Swelling (edema) of the legs, ankles, or feet.   Tiredness (lethargy).   Nausea or vomiting.   Confusion.   Problems with urination, such as:   Painful or burning feeling during urination.   Decreased urine production.   Frequent accidents in children who are potty trained.   Bloody urine.   Muscle twitches and cramps.   Shortness of breath.   Seizures.   Chest  pain or pressure. Sometimes, no symptoms are present. DIAGNOSIS Acute kidney injury may be detected and diagnosed by tests, including blood, urine, imaging, or kidney biopsy tests.  TREATMENT Treatment of acute kidney injury varies depending on the cause and severity of the kidney damage. In mild cases, no treatment may be needed. The kidneys may heal on their own. If acute kidney injury is more severe, your health care provider will treat the cause of the kidney damage, help the kidneys heal, and prevent complications from occurring. Severe cases may require a procedure to remove toxic wastes from the body (dialysis) or surgery to repair kidney damage. Surgery may involve:   Repair of a torn kidney.   Removal of an obstruction. HOME CARE INSTRUCTIONS  Follow your prescribed diet.  Take medicines only as directed by your health care provider.  Do not take any new medicines (prescription, over-the-counter, or nutritional supplements) unless approved by your health care provider. Many medicines can worsen your kidney damage or may need to have the dose adjusted.   Keep all follow-up visits as directed by your health care provider. This is important.  Observe your condition to make sure you are healing as expected. SEEK IMMEDIATE MEDICAL CARE IF:  You are feeling ill or have severe pain in the back or side.   Your symptoms return or you have new symptoms.  You have any symptoms of end-stage kidney disease. These include:   Persistent itchiness.  Loss of appetite.   Headaches.   Abnormally dark or light skin.  Numbness in the hands or feet.   Easy bruising.   Frequent hiccups.   Menstruation stops.   You have a fever.  You have increased urine production.  You have pain or bleeding when urinating. MAKE SURE YOU:   Understand these instructions.  Will watch your condition.  Will get help right away if you are not doing well or get worse.   This  information is not intended to replace advice given to you by your health care provider. Make sure you discuss any questions you have with your health care provider.   Document Released: 12/21/2010 Document Revised: 06/28/2014 Document Reviewed: 02/04/2012 Elsevier Interactive Patient Education [employer].

## 2015-07-23 NOTE — Progress Notes (Signed)
This CM sent copies of discharge scripts and order for neb machine for home to [facility]. Fax [phone]. Pt to dc home via ambulance. Marney Doctor RN,BSN,NCM [phone]

## 2015-07-23 DEATH — deceased

## 2015-12-09 ENCOUNTER — Other Ambulatory Visit: Payer: Self-pay | Admitting: Nurse Practitioner

## 2016-03-24 ENCOUNTER — Telehealth: Payer: Self-pay | Admitting: Neurology

## 2016-03-24 NOTE — Telephone Encounter (Signed)
Tammy/ [employer] called with questions regarding records she received on the pt. Please call [phone]- Tammy Regarding - Skin lesion he had removed that turned out to be cancerous. What type of cancer what it?

## 2016-03-24 NOTE — Telephone Encounter (Signed)
Returned call to Tammy - left message.  We are unable to clarify her question about what type of cancer.  We treated him for his seizure disorder.  I advised her to contact his PCP for further help.  Left our number to call back, if needed.

## 2016-08-13 IMAGING — CT CT CHEST W/O CM
2 of 4 series · 15 of 36 positions shown, 18 images · non-contrast
Comparison: 06/09/2015

CLINICAL DATA: Decreased appetite. Failure to thrive. New diagnosis
of metastatic lung cancer. Back pain radiating to the legs. Cough
and weakness. Weight loss.

EXAM:
CT CHEST WITHOUT CONTRAST
TECHNIQUE: Multidetector CT imaging of the chest was performed following the
standard protocol without IV contrast.

[Series 2: chest w/o st · axial · non-contrast · 0.69mm/px · z∈[-302,-37]mm · 12 of 63 slices shown, 15 images]
[im 5/63  mediastinal]
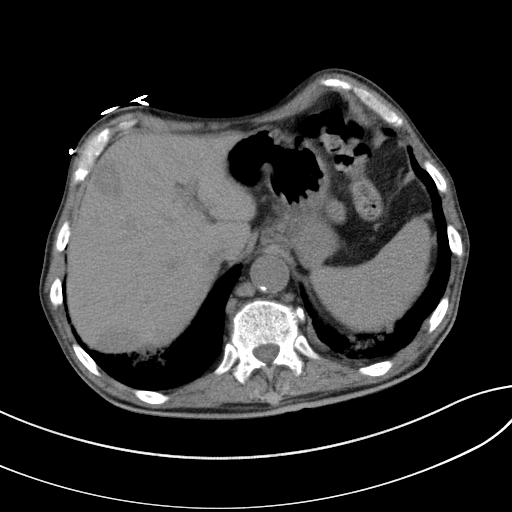
[im 5/63  lung]
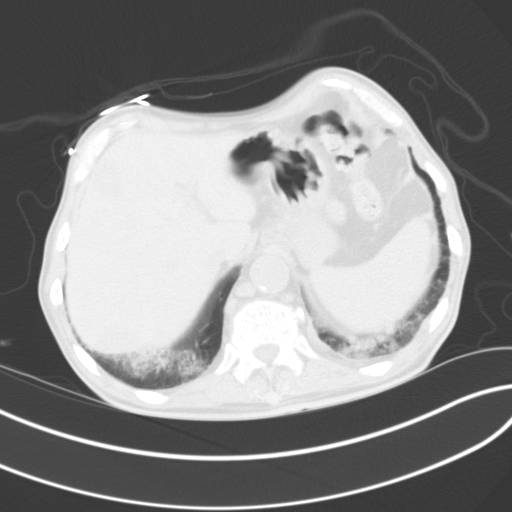
[im 10/63  lung]
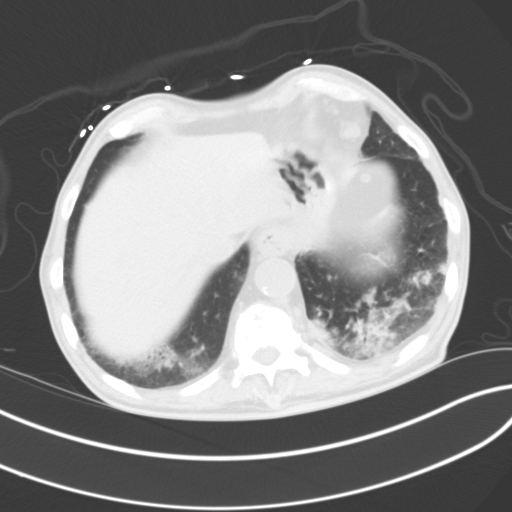
[im 15/63  lung]
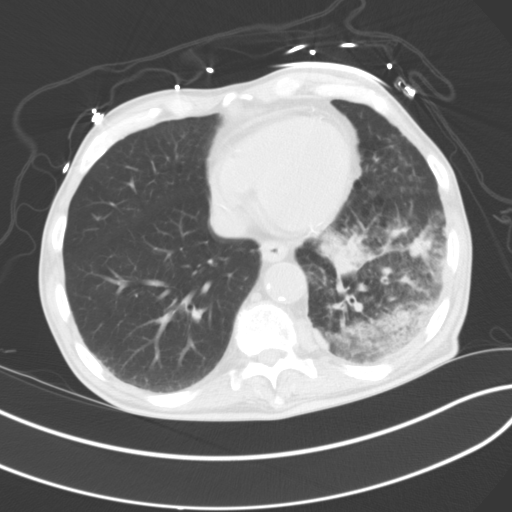
[im 20/63  lung]
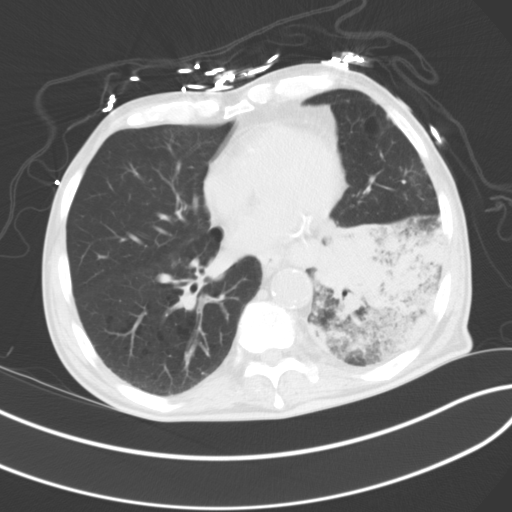
[im 24/63  mediastinal]
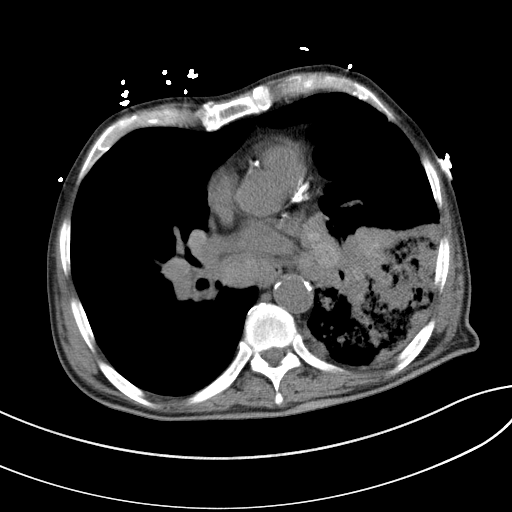
[im 24/63  lung]
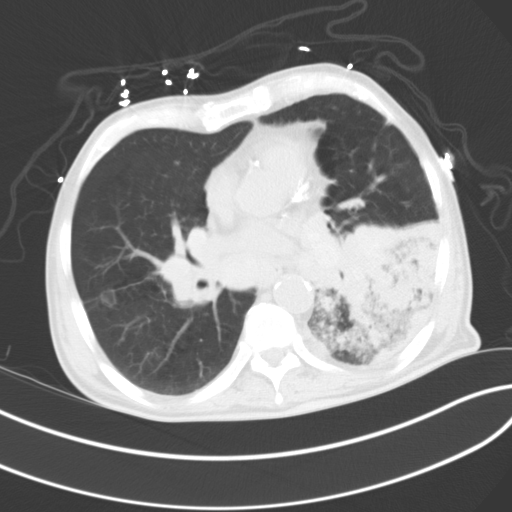
[im 29/63  lung]
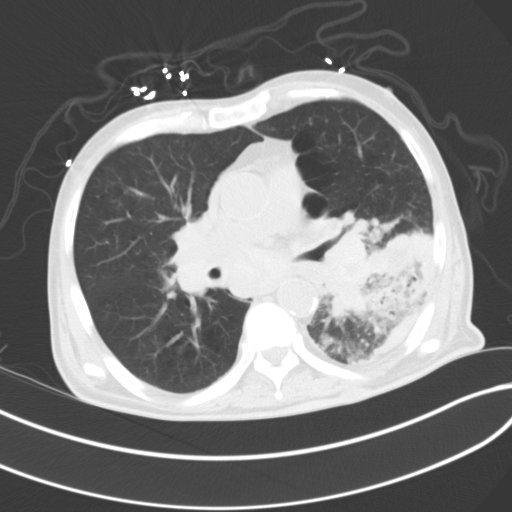
[im 34/63  lung]
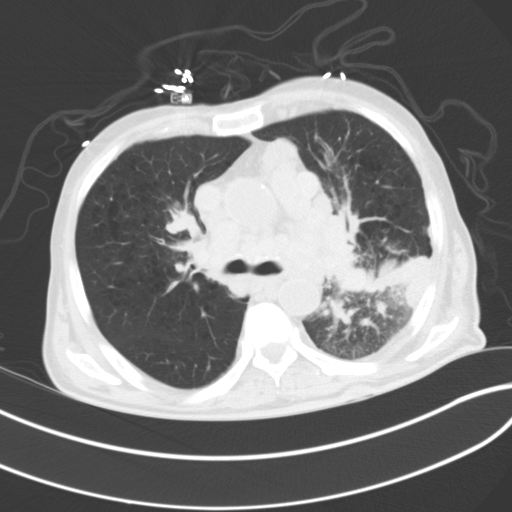
[im 39/63  lung]
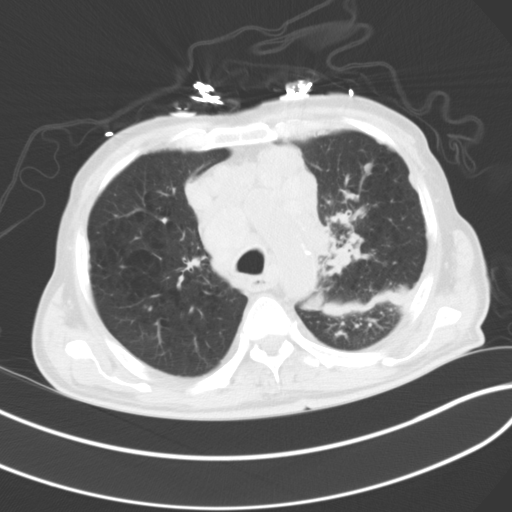
[im 43/63  mediastinal]
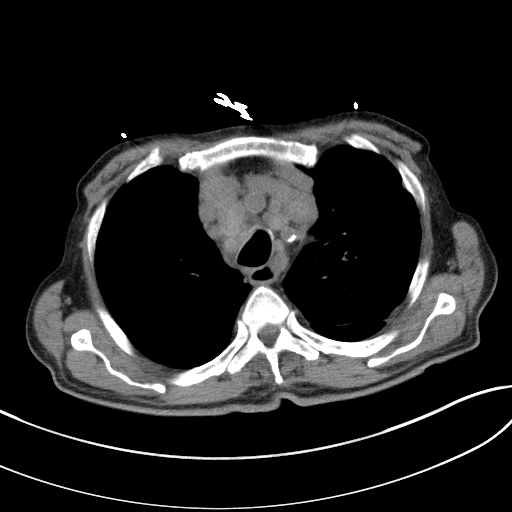
[im 43/63  lung]
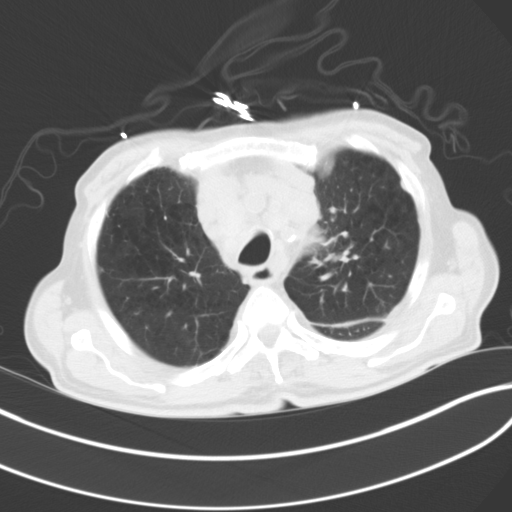
[im 48/63  lung]
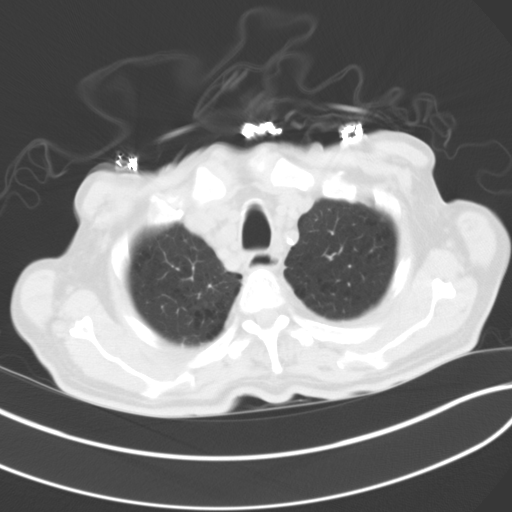
[im 53/63  lung]
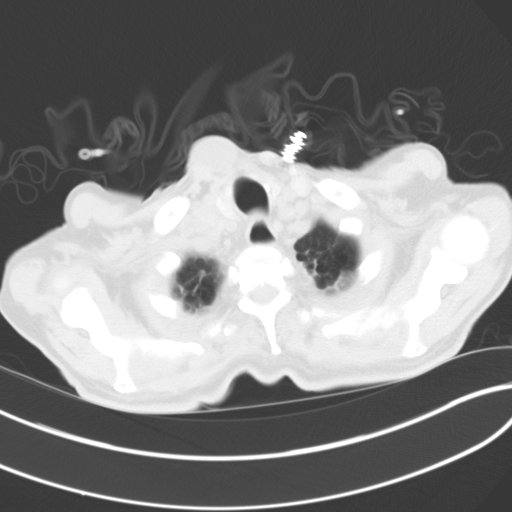
[im 58/63  lung]
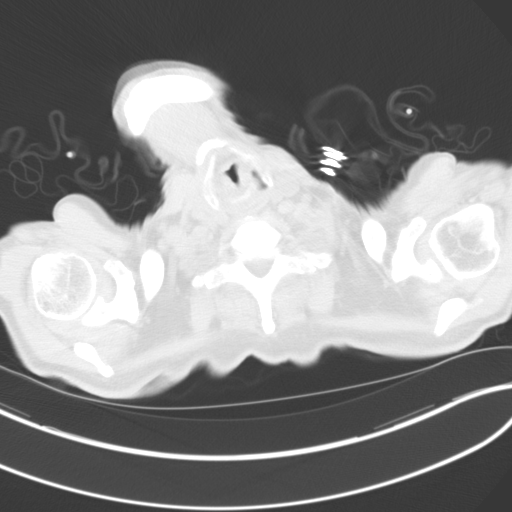

[Series 3: coronals · coronal · 0.61mm/px · 3 of 99 slices shown]
[im 20/99  lung]
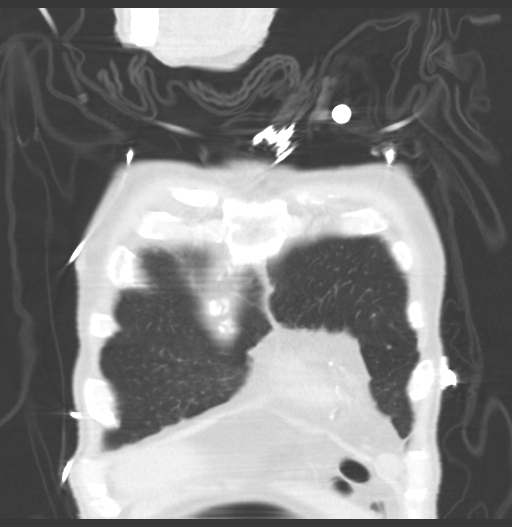
[im 40/99  lung]
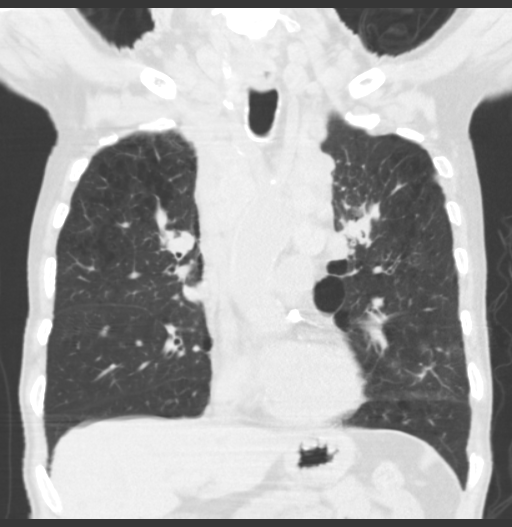
[im 59/99  lung]
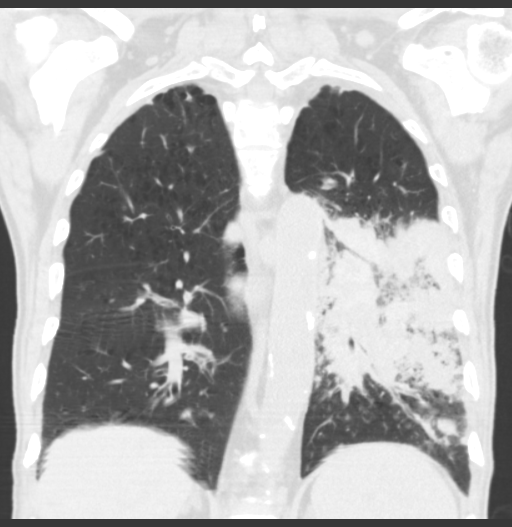

[15 of 36 positions shown; findings below may reference images not displayed]

FINDINGS: Mediastinum/Nodes: Extensive supraclavicular, prevascular,
paratracheal, subcarinal, hilar, infrahilar, and periesophageal as
well as pericardial adenopathy not dissimilar to prior. Subcarinal
node 3.8 cm in short axis on image 36 series 2, previously the same
by my measurement.

Coronary, aortic arch, and branch vessel atherosclerotic vascular
disease. Low-density blood pool, suggesting anemia.

Lungs/Pleura: Emphysema. Extensive pleural nodularity and pleural
masses on the left, especially along the major fissure and left
lower lobe and to a lesser extent along the left upper lobe,
compatible with pleural spread of malignancy. There is confluent
mass and airspace disease in the left lower lobe with an increase in
the amount of airspace disease probably representing pneumonia or
pulmonary hemorrhage superimposed on the extensive left lower lobe
malignancy. Left lower lobe bronchus demonstrates severe extrinsic
compression (stable from 06/09/2015) and there is also tumor
wrapping around the left upper lobe bronchi. Scattered satellite
nodules are present throughout much of the left lower lobe.

There is mild increase in patchy interstitial and airspace opacity
in the right lower lobe. Airway thickening in the right upper lobe
with suspected peribronchovascular tumor increased from prior.
Considerable extrinsic compression of the right upper lobe bronchus,
increased.

Upper abdomen: Hypodense lesions in the liver are likely metastatic.

Musculoskeletal: No rib destruction identified. Somewhat short
vertebra at C6 and C7 of uncertain significance. Superior endplate
concavity and mild adjacent lucency in the T5 vertebral body,
similar to prior, possibly a Schmorl' s node and less likely to be
metastatic.
IMPRESSION: 1. The dominant change is increase in airspace opacity in the left
lower lobe, probably from postobstructive pneumonia/pneumonitis,
less likely from pulmonary hemorrhage.
2. Stable heavy metastatic burden in the chest and upper abdomen.
3. Increased extrinsic airway compression of the right upper lobe
bronchus due to tumor.
4. Coronary, aortic arch, and branch vessel atherosclerotic vascular
disease.
5. Emphysema.
6. Low-density blood pool, suggesting anemia.
